# Patient Record
Sex: Female | Born: 1985 | Race: White | Hispanic: No | Smoking: Never smoker
Health system: Southern US, Community
[De-identification: ages and names within clinical notes are randomized; demographics above are authoritative.]

## PROBLEM LIST (undated history)

## (undated) DIAGNOSIS — Z789 Other specified health status: Secondary | ICD-10-CM

## (undated) DIAGNOSIS — O24419 Gestational diabetes mellitus in pregnancy, unspecified control: Secondary | ICD-10-CM

## (undated) HISTORY — DX: Other specified health status: Z78.9

## (undated) HISTORY — DX: Gestational diabetes mellitus in pregnancy, unspecified control: O24.419

## (undated) NOTE — *Deleted (*Deleted)
History     [id number]  Arrival date and time: 09/28/20 1722    Chief Complaint  Patient presents with  . Rupture of Membranes     HPI Heidi [name] is a 75 y.o. at [redacted]w[redacted]d by 13wk [place] with PMHx notable for IUGR, who presents from [facility] for evaluation.   Patient seen for antenatal testing today at [facility] Diagnosed with [facility]FM on 08/14/2020 and infant was at 3% Repeat scan on 09/12/2020, 9% at that time, BPP 10/10 Today patient had BPP and dopplers, report not in but per verbal report from Heidi [name] patient's AFI now decreased 9>4 Sent to MAU for rule out rupture, IV hydration, and repeat BPP once IVF are complete  On history patient reports pain with fetal movements in pelvis that cause cramping and occasional contractions that hurt in her back She attributes this to breech position Yesterday thought she may have urinated unintentionally around 1900 the night prior, was laughing very hard at the time Had to change her pants, no color or odor to fuid No vaginal bleeding Ate a biscuit and had emesis this morning, has not felt nauseous since then, no other nausea or vomiting      OB History    Gravida  4   Para  3   Term  3   Preterm      AB      Living  3     SAB      TAB      Ectopic      Multiple      Live Births              Past Medical History:  Diagnosis Date  . Medical history non-contributory     Past Surgical History:  Procedure Laterality Date  . CESAREAN SECTION      History reviewed. No pertinent family history.  Social History   Socioeconomic History  . Marital status: Significant Other    Spouse name: Not on file  . Number of children: Not on file  . Years of education: Not on file  . Highest education level: Not on file  Occupational History  . Not on file  Tobacco Use  . Smoking status: Never Smoker  . Smokeless tobacco: Never Used  Vaping Use  . Vaping Use: Never used  Substance and Sexual Activity  . Alcohol use: Never  .  Drug use: Never  . Sexual activity: Yes  Other Topics Concern  . Not on file  Social History Narrative  . Not on file   Social Determinants of Health   Financial Resource Strain:   . Difficulty of Paying Living Expenses: Not on file  Food Insecurity:   . Worried About Programme researcher, broadcasting/film/video in the Last Year: Not on file  . Ran Out of Food in the Last Year: Not on file  Transportation Needs:   . Lack of Transportation (Medical): Not on file  . Lack of Transportation (Non-Medical): Not on file  Physical Activity:   . Days of Exercise per Week: Not on file  . Minutes of Exercise per Session: Not on file  Stress:   . Feeling of Stress : Not on file  Social Connections:   . Frequency of Communication with Friends and Family: Not on file  . Frequency of Social Gatherings with Friends and Family: Not on file  . Attends Religious Services: Not on file  . Active Member of Clubs or Organizations: Not on file  . Attends  Club or Organization Meetings: Not on file  . Marital Status: Not on file  Intimate Partner Violence:   . Fear of Current or Ex-Partner: Not on file  . Emotionally Abused: Not on file  . Physically Abused: Not on file  . Sexually Abused: Not on file    No Known Allergies  No current facility-administered medications on file prior to encounter.   Current Outpatient Medications on File Prior to Encounter  Medication Sig Dispense Refill  . Prenatal Vit-Fe Fumarate-FA (PRENATAL VITAMIN PO) Take by mouth.    Marland Kitchen HYDROcodone-acetaminophen (NORCO/VICODIN) 5-325 MG tablet Take 1 tablet by mouth every 6 (six) hours as needed for moderate pain or severe pain. (Patient not taking: Reported on 05/02/2020) 8 tablet 0  . penicillin v potassium (VEETID) 500 MG tablet Take 1 tablet (500 mg total) by mouth 3 (three) times daily. (Patient not taking: Reported on 05/02/2020) 30 tablet 0     ROS Pertinent positives and negative per HPI, all others reviewed and negative  Physical Exam    BP 118/71 (BP Location: Right Arm)   Pulse (!) 101   Temp 97.9 F (36.6 C) (Oral)   Resp 20   Ht 5\' 2"  (1.575 m)   Wt 72.9 kg   LMP 01/29/2020 (Exact Date)   SpO2 100%   BMI 29.39 kg/m   Physical Exam ***  Cervical Exam    Bedside Ultrasound ***  My interpretation: ***  FHT Baseline ***, *** variability, ***accels, ***decels Toco: *** Cat: ***  Labs No results found for this or any previous visit (from the past 24 hour(s)).  Imaging No results found.  MAU Course  Procedures Lab Orders  No laboratory test(s) ordered today   Meds ordered this encounter  Medications  . lactated ringers bolus 1,000 mL   Imaging Orders  No imaging studies ordered today    MDM {Desc; none/mild/moderate/severe:13498}  Assessment and Plan  ***  #FWB FHT Cat *** NST: ***  [name]

---

## 2015-01-22 DIAGNOSIS — O321XX0 Maternal care for breech presentation, not applicable or unspecified: Secondary | ICD-10-CM

## 2019-12-07 ENCOUNTER — Other Ambulatory Visit: Payer: Self-pay

## 2019-12-07 ENCOUNTER — Encounter (HOSPITAL_COMMUNITY): Payer: Self-pay

## 2019-12-07 ENCOUNTER — Ambulatory Visit (HOSPITAL_COMMUNITY)
Admission: EM | Admit: 2019-12-07 | Discharge: 2019-12-07 | Disposition: A | Discharge status: Home / Self Care | Payer: Medicaid - Out of State | Attending: Family Medicine | Admitting: Family Medicine

## 2019-12-07 DIAGNOSIS — K0889 Other specified disorders of teeth and supporting structures: Secondary | ICD-10-CM | POA: Diagnosis not present

## 2019-12-07 MED ORDER — KETOROLAC TROMETHAMINE 60 MG/2ML IM SOLN
INTRAMUSCULAR | Status: AC
  Filled 2019-12-07: qty 2

## 2019-12-07 MED ORDER — PENICILLIN V POTASSIUM 500 MG PO TABS: 500.0000 mg | ORAL_TABLET | Freq: Three times a day (TID) | ORAL | 0 refills | Status: DC

## 2019-12-07 MED ORDER — KETOROLAC TROMETHAMINE 60 MG/2ML IM SOLN
60.0000 mg | Freq: Once | INTRAMUSCULAR | Status: AC
  Administered 2019-12-07: 60 mg via INTRAMUSCULAR

## 2019-12-07 MED ORDER — HYDROCODONE-ACETAMINOPHEN 5-325 MG PO TABS: 1.0000 | ORAL_TABLET | Freq: Four times a day (QID) | ORAL | 0 refills | Status: DC | PRN

## 2019-12-07 NOTE — ED Provider Notes (Signed)
[facility]   [id number] 12/07/19 Arrival Time: 1529  ASSESSMENT & PLAN:  1. Pain, dental     No sign of abscess requiring I&D at this time. Discussed.  Meds ordered this encounter  Medications  . ketorolac (TORADOL) injection 60 mg  . penicillin v potassium (VEETID) 500 MG tablet    Sig: Take 1 tablet (500 mg total) by mouth 3 (three) times daily.    Dispense:  30 tablet    Refill:  0  . HYDROcodone-acetaminophen (NORCO/VICODIN) 5-325 MG tablet    Sig: Take 1 tablet by mouth every 6 (six) hours as needed for moderate pain or severe pain.    Dispense:  8 tablet    Refill:  0    [place] Controlled Substances Registry consulted for this patient. I feel the risk/benefit ratio today is favorable for proceeding with this prescription for a controlled substance. Medication sedation precautions given.  Dental resource written instructions given. She will schedule dental evaluation as soon as possible if not improving over the next 24-48 hours.  Reviewed expectations re: course of current medical issues. Questions answered. Outlined signs and symptoms indicating need for more acute intervention. Patient verbalized understanding. After Visit Summary given.   SUBJECTIVE:  Heidi [name] is a 34 y.o. female who reports gradual onset of right lower dental pain described as aching/throbbing. Present for several days up to one week. Fever: absent. Tolerating PO intake but reports pain with chewing. Normal swallowing. She does not see a dentist regularly. No neck swelling or pain. OTC analgesics without relief.  OBJECTIVE: Vitals:   12/07/19 1544 12/07/19 1546  BP:  122/84  Pulse:  96  Resp:  16  Temp:  98.2 F (36.8 C)  TempSrc:  Oral  SpO2:  99%  Weight: 68 kg     General appearance: alert; no distress HENT: normocephalic; atraumatic; dentition: poor; right lower gums without areas of fluctuance, drainage, or bleeding and with tenderness to palpation; normal jaw movement  without difficulty Neck: supple without LAD; FROM; trachea midline Lungs: normal respirations; unlabored Skin: warm and dry Psychological: alert and cooperative; normal mood and affect  No Known Allergies  History reviewed. No pertinent past medical history.   Social History   Socioeconomic History  . Marital status: Significant Other    Spouse name: Not on file  . Number of children: Not on file  . Years of education: Not on file  . Highest education level: Not on file  Occupational History  . Not on file  Tobacco Use  . Smoking status: Never Smoker  . Smokeless tobacco: Never Used  Substance and Sexual Activity  . Alcohol use: Never  . Drug use: Not on file  . Sexual activity: Yes    Birth control/protection: I.U.D.  Other Topics Concern  . Not on file  Social History Narrative  . Not on file   Social Determinants of Health   Financial Resource Strain:   . Difficulty of Paying Living Expenses: Not on file  Food Insecurity:   . Worried About Programme researcher, broadcasting/film/video in the Last Year: Not on file  . Ran Out of Food in the Last Year: Not on file  Transportation Needs:   . Lack of Transportation (Medical): Not on file  . Lack of Transportation (Non-Medical): Not on file  Physical Activity:   . Days of Exercise per Week: Not on file  . Minutes of Exercise per Session: Not on file  Stress:   . Feeling of Stress :  Not on file  Social Connections:   . Frequency of Communication with Friends and Family: Not on file  . Frequency of Social Gatherings with Friends and Family: Not on file  . Attends Religious Services: Not on file  . Active Member of Clubs or Organizations: Not on file  . Attends Archivist Meetings: Not on file  . Marital Status: Not on file  Intimate Partner Violence:   . Fear of Current or Ex-Partner: Not on file  . Emotionally Abused: Not on file  . Physically Abused: Not on file  . Sexually Abused: Not on file   History reviewed. No  pertinent family history. History reviewed. No pertinent surgical history.   Vanessa Kick, MD 12/08/19 [phone]

## 2019-12-07 NOTE — Discharge Instructions (Signed)

## 2019-12-07 NOTE — ED Triage Notes (Signed)
Pt states she has a broken tooth with a abscess x 1 week or more.

## 2020-04-24 ENCOUNTER — Other Ambulatory Visit: Payer: Self-pay | Admitting: Nurse Practitioner

## 2020-04-24 DIAGNOSIS — Z369 Encounter for antenatal screening, unspecified: Secondary | ICD-10-CM

## 2020-04-24 DIAGNOSIS — Z3A13 13 weeks gestation of pregnancy: Secondary | ICD-10-CM

## 2020-04-27 ENCOUNTER — Encounter: Payer: Self-pay | Admitting: *Deleted

## 2020-05-02 ENCOUNTER — Other Ambulatory Visit: Payer: Self-pay

## 2020-05-02 ENCOUNTER — Ambulatory Visit: Payer: Medicaid Other | Admitting: *Deleted

## 2020-05-02 ENCOUNTER — Ambulatory Visit: Payer: Medicaid Other | Attending: Obstetrics and Gynecology

## 2020-05-02 ENCOUNTER — Ambulatory Visit: Payer: Medicaid Other

## 2020-05-02 VITALS — BP 106/71 | HR 94 | Ht 62.5 in | Wt 157.0 lb

## 2020-05-02 DIAGNOSIS — Z369 Encounter for antenatal screening, unspecified: Secondary | ICD-10-CM | POA: Diagnosis not present

## 2020-05-02 DIAGNOSIS — Z3682 Encounter for antenatal screening for nuchal translucency: Secondary | ICD-10-CM

## 2020-05-02 DIAGNOSIS — O34219 Maternal care for unspecified type scar from previous cesarean delivery: Secondary | ICD-10-CM

## 2020-05-02 DIAGNOSIS — Z3A13 13 weeks gestation of pregnancy: Secondary | ICD-10-CM

## 2020-05-04 ENCOUNTER — Telehealth: Payer: Self-pay | Admitting: Genetic Counselor

## 2020-05-04 LAB — FIRST TRIMESTER SCREEN W/NT
CRL: 72.1 mm
DIA MoM: 1.09
DIA Value: 216.6 pg/mL
Gest Age-Collect: 13.1 weeks
Maternal Age At EDD: 32.5 yr
Nuchal Translucency MoM: 1.01
Nuchal Translucency: 1.6 mm
Number of Fetuses: 1
PAPP-A MoM: 1.15
PAPP-A Value: 1379.1 ng/mL
Test Results:: NEGATIVE
Weight: 157 [lb_av]
hCG MoM: 1.2
hCG Value: 96.9 IU/mL

## 2020-05-04 NOTE — Telephone Encounter (Signed)
I called Ms. 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 We reviewed that the risk for her pregnancy to be affected by Down syndrome decreased from her 1 in 486 age-related risk to less than 1 in 10,000, and the risk for trisomy 18 decreased from her 1 in 951 age-related risk to less than 1 in 10,000 based on the results of this screen. Ms. Bunte was reminded that while this screen significantly reduces the likelihood of the pregnancy being affected by trisomy 97 or trisomy 75, it cannot be considered diagnostic. Diagnostic testing via [facility] or amniocentesis is available should she be interested in pursuing this. Additionally, first trimester screening does not screen for open neural tube defects such as spina bifida. It is recommended that MS-AFP screening be ordered around 16-18 weeks to screen for this. Ms. Crume confirmed that she had no questions about these results.  Gershon Crane, MS, [facility] Genetic Counselor

## 2020-08-21 ENCOUNTER — Other Ambulatory Visit: Payer: Self-pay | Admitting: Obstetrics and Gynecology

## 2020-08-21 DIAGNOSIS — Z364 Encounter for antenatal screening for fetal growth retardation: Secondary | ICD-10-CM

## 2020-09-12 ENCOUNTER — Ambulatory Visit: Payer: Medicaid Other | Attending: Obstetrics and Gynecology

## 2020-09-12 ENCOUNTER — Other Ambulatory Visit: Payer: Self-pay | Admitting: Obstetrics and Gynecology

## 2020-09-12 ENCOUNTER — Other Ambulatory Visit: Payer: Self-pay | Admitting: *Deleted

## 2020-09-12 ENCOUNTER — Ambulatory Visit: Payer: Medicaid Other | Admitting: *Deleted

## 2020-09-12 ENCOUNTER — Encounter: Payer: Self-pay | Admitting: *Deleted

## 2020-09-12 ENCOUNTER — Other Ambulatory Visit: Payer: Self-pay

## 2020-09-12 VITALS — BP 118/70 | HR 91

## 2020-09-12 DIAGNOSIS — Z364 Encounter for antenatal screening for fetal growth retardation: Secondary | ICD-10-CM

## 2020-09-12 DIAGNOSIS — O365930 Maternal care for other known or suspected poor fetal growth, third trimester, not applicable or unspecified: Secondary | ICD-10-CM | POA: Diagnosis present

## 2020-09-12 DIAGNOSIS — O365990 Maternal care for other known or suspected poor fetal growth, unspecified trimester, not applicable or unspecified: Secondary | ICD-10-CM

## 2020-09-12 DIAGNOSIS — O34219 Maternal care for unspecified type scar from previous cesarean delivery: Secondary | ICD-10-CM

## 2020-09-12 DIAGNOSIS — Z3A32 32 weeks gestation of pregnancy: Secondary | ICD-10-CM | POA: Insufficient documentation

## 2020-09-12 NOTE — Procedures (Signed)
Heidi [name] February 19, 1986 [redacted]w[redacted]d  Fetus A Non-Stress Test Interpretation for 09/12/20  Indication: Unsatisfactory BPP, SGA  Fetal Heart Rate A Mode: External Baseline Rate (A): 130 bpm Variability: Moderate Accelerations: 15 x 15 Decelerations: None Multiple birth?: No  Uterine Activity Mode: Palpation, Toco Contraction Frequency (min): None Resting Tone Palpated: Relaxed Resting Time: Adequate  Interpretation (Fetal Testing) Nonstress Test Interpretation: Reactive Comments: Dr. Judeth Cornfield reviewed tracing.

## 2020-09-14 DIAGNOSIS — O365990 Maternal care for other known or suspected poor fetal growth, unspecified trimester, not applicable or unspecified: Secondary | ICD-10-CM | POA: Insufficient documentation

## 2020-09-19 ENCOUNTER — Encounter: Payer: Self-pay | Admitting: *Deleted

## 2020-09-19 ENCOUNTER — Ambulatory Visit: Payer: Medicaid Other | Admitting: *Deleted

## 2020-09-19 ENCOUNTER — Ambulatory Visit: Payer: Medicaid Other | Attending: Obstetrics and Gynecology | Admitting: *Deleted

## 2020-09-19 ENCOUNTER — Other Ambulatory Visit: Payer: Self-pay

## 2020-09-19 DIAGNOSIS — Z3A33 33 weeks gestation of pregnancy: Secondary | ICD-10-CM | POA: Diagnosis not present

## 2020-09-19 DIAGNOSIS — O365930 Maternal care for other known or suspected poor fetal growth, third trimester, not applicable or unspecified: Secondary | ICD-10-CM | POA: Insufficient documentation

## 2020-09-19 DIAGNOSIS — O365990 Maternal care for other known or suspected poor fetal growth, unspecified trimester, not applicable or unspecified: Secondary | ICD-10-CM

## 2020-09-19 NOTE — Procedures (Signed)
Tinzlee Premo 1985-11-08 [redacted]w[redacted]d  Fetus A Non-Stress Test Interpretation for 09/19/20  Indication: IUGR  Fetal Heart Rate A Mode: External Baseline Rate (A): 140 bpm Variability: Moderate Accelerations: 15 x 15 Decelerations: None Multiple birth?: No  Uterine Activity Mode: Palpation, Toco Contraction Frequency (min): None Resting Tone Palpated: Relaxed Resting Time: Adequate  Interpretation (Fetal Testing) Nonstress Test Interpretation: Reactive Comments: Dr. Grace Bushy reviewed tracing.

## 2020-09-28 ENCOUNTER — Ambulatory Visit: Payer: Medicaid Other | Attending: Obstetrics and Gynecology

## 2020-09-28 ENCOUNTER — Inpatient Hospital Stay (HOSPITAL_BASED_OUTPATIENT_CLINIC_OR_DEPARTMENT_OTHER): Payer: Medicaid Other

## 2020-09-28 ENCOUNTER — Encounter (HOSPITAL_COMMUNITY): Payer: Self-pay | Admitting: Family Medicine

## 2020-09-28 ENCOUNTER — Encounter: Payer: Self-pay | Admitting: *Deleted

## 2020-09-28 ENCOUNTER — Ambulatory Visit: Payer: Medicaid Other | Admitting: *Deleted

## 2020-09-28 ENCOUNTER — Inpatient Hospital Stay (HOSPITAL_COMMUNITY)
Admission: AD | Admit: 2020-09-28 | Discharge: 2020-10-03 | DRG: 788 | Disposition: A | Discharge status: Home / Self Care | Payer: Medicaid Other | Attending: Obstetrics and Gynecology | Admitting: Obstetrics and Gynecology

## 2020-09-28 ENCOUNTER — Other Ambulatory Visit: Payer: Self-pay

## 2020-09-28 DIAGNOSIS — O365990 Maternal care for other known or suspected poor fetal growth, unspecified trimester, not applicable or unspecified: Secondary | ICD-10-CM | POA: Insufficient documentation

## 2020-09-28 DIAGNOSIS — O365930 Maternal care for other known or suspected poor fetal growth, third trimester, not applicable or unspecified: Secondary | ICD-10-CM | POA: Diagnosis not present

## 2020-09-28 DIAGNOSIS — Z363 Encounter for antenatal screening for malformations: Secondary | ICD-10-CM | POA: Diagnosis not present

## 2020-09-28 DIAGNOSIS — Z87898 Personal history of other specified conditions: Secondary | ICD-10-CM

## 2020-09-28 DIAGNOSIS — O4100X0 Oligohydramnios, unspecified trimester, not applicable or unspecified: Secondary | ICD-10-CM | POA: Diagnosis present

## 2020-09-28 DIAGNOSIS — Z98891 History of uterine scar from previous surgery: Secondary | ICD-10-CM | POA: Diagnosis present

## 2020-09-28 DIAGNOSIS — O4103X0 Oligohydramnios, third trimester, not applicable or unspecified: Secondary | ICD-10-CM

## 2020-09-28 DIAGNOSIS — O365931 Maternal care for other known or suspected poor fetal growth, third trimester, fetus 1: Secondary | ICD-10-CM | POA: Insufficient documentation

## 2020-09-28 DIAGNOSIS — O34219 Maternal care for unspecified type scar from previous cesarean delivery: Secondary | ICD-10-CM | POA: Diagnosis not present

## 2020-09-28 DIAGNOSIS — O321XX0 Maternal care for breech presentation, not applicable or unspecified: Secondary | ICD-10-CM

## 2020-09-28 DIAGNOSIS — Z23 Encounter for immunization: Secondary | ICD-10-CM

## 2020-09-28 DIAGNOSIS — Z3A34 34 weeks gestation of pregnancy: Secondary | ICD-10-CM

## 2020-09-28 DIAGNOSIS — O34211 Maternal care for low transverse scar from previous cesarean delivery: Secondary | ICD-10-CM | POA: Diagnosis present

## 2020-09-28 DIAGNOSIS — Z20822 Contact with and (suspected) exposure to covid-19: Secondary | ICD-10-CM | POA: Diagnosis present

## 2020-09-28 LAB — WET PREP, GENITAL
Clue Cells Wet Prep HPF POC: NONE SEEN
Sperm: NONE SEEN
Trich, Wet Prep: NONE SEEN
Yeast Wet Prep HPF POC: NONE SEEN

## 2020-09-28 LAB — TYPE AND SCREEN
ABO/RH(D): A POS
Antibody Screen: NEGATIVE

## 2020-09-28 LAB — CBC
HCT: 30.5 % — ABNORMAL LOW (ref 36.0–46.0)
Hemoglobin: 10.4 g/dL — ABNORMAL LOW (ref 12.0–15.0)
MCH: 29.8 pg (ref 26.0–34.0)
MCHC: 34.1 g/dL (ref 30.0–36.0)
MCV: 87.4 fL (ref 80.0–100.0)
Platelets: 200 10*3/uL (ref 150–400)
RBC: 3.49 MIL/uL — ABNORMAL LOW (ref 3.87–5.11)
RDW: 12.7 % (ref 11.5–15.5)
WBC: 10.2 10*3/uL (ref 4.0–10.5)
nRBC: 0 % (ref 0.0–0.2)

## 2020-09-28 LAB — RESP PANEL BY RT-PCR (FLU A&B, COVID) ARPGX2
Influenza A by PCR: NEGATIVE
Influenza B by PCR: NEGATIVE
SARS Coronavirus 2 by RT PCR: NEGATIVE

## 2020-09-28 LAB — AMNISURE RUPTURE OF MEMBRANE (ROM) NOT AT ARMC: Amnisure ROM: NEGATIVE

## 2020-09-28 MED ORDER — PRENATAL MULTIVITAMIN CH
1.0000 | ORAL_TABLET | Freq: Every day | ORAL | Status: DC
  Administered 2020-09-29: 1 via ORAL
  Filled 2020-09-28: qty 1

## 2020-09-28 MED ORDER — DOCUSATE SODIUM 100 MG PO CAPS
100.0000 mg | ORAL_CAPSULE | Freq: Every day | ORAL | Status: DC
  Administered 2020-09-29: 100 mg via ORAL
  Filled 2020-09-28: qty 1

## 2020-09-28 MED ORDER — BETAMETHASONE SOD PHOS & ACET 6 (3-3) MG/ML IJ SUSP
12.0000 mg | INTRAMUSCULAR | Status: AC
  Administered 2020-09-28 – 2020-09-29 (×2): 12 mg via INTRAMUSCULAR
  Filled 2020-09-28 (×2): qty 5

## 2020-09-28 MED ORDER — ZOLPIDEM TARTRATE 5 MG PO TABS: 5.0000 mg | ORAL_TABLET | Freq: Every evening | ORAL | Status: DC | PRN

## 2020-09-28 MED ORDER — CALCIUM CARBONATE ANTACID 500 MG PO CHEW: 2.0000 | CHEWABLE_TABLET | ORAL | Status: DC | PRN

## 2020-09-28 MED ORDER — ACETAMINOPHEN 325 MG PO TABS
650.0000 mg | ORAL_TABLET | ORAL | Status: DC | PRN
  Administered 2020-09-29: 650 mg via ORAL
  Filled 2020-09-28 (×2): qty 2

## 2020-09-28 MED ORDER — LACTATED RINGERS IV SOLN: INTRAVENOUS | Status: DC

## 2020-09-28 MED ORDER — LACTATED RINGERS IV BOLUS
1000.0000 mL | Freq: Once | INTRAVENOUS | Status: AC
  Administered 2020-09-28: 1000 mL via INTRAVENOUS

## 2020-09-28 NOTE — MAU Note (Signed)
Sent to MAU from MFM for evaluation of ROM.  Pt reports had U/S today and low fluid noted and needs further evaluation.  Denies VB.  Endorses +FM.

## 2020-09-28 NOTE — H&P (Signed)
FACULTY PRACTICE ANTEPARTUM ADMISSION HISTORY AND PHYSICAL NOTE   History of Present Illness: Heidi [name] is a 34 y.o. [id number] at [redacted]w[redacted]d admitted for oligohydramnios.   Patient reports the fetal movement as active. Patient reports uterine contraction  activity as none, has some cramping. Patient reports  vaginal bleeding as none. Patient describes fluid per vagina as None. Fetal presentation is breech.  Patient seen for antenatal testing today at [facility] Diagnosed with MFM on 08/14/2020 and infant was at 3% Repeat scan on 09/12/2020, 9% at that time, BPP 10/10 Today patient had BPP and dopplers, report not in but per verbal report from Dr. Parke Poisson patient's AFI now decreased 9>4 Sent to MAU for rule out rupture, IV hydration, and repeat BPP once IVF are complete  On history patient reports pain with fetal movements in pelvis that cause cramping and occasional contractions that hurt in her back She attributes this to breech position Yesterday thought she may have urinated unintentionally around 1900 the night prior, was laughing very hard at the time Had to change her pants, no color or odor to fuid No vaginal bleeding Ate a biscuit and had emesis this morning, has not felt nauseous since then, no other nausea or vomiting  Patient Active Problem List   Diagnosis Date Noted  . Oligohydramnios 09/28/2020  . Pregnancy affected by fetal growth restriction 09/14/2020    Past Medical History:  Diagnosis Date  . Medical history non-contributory     Past Surgical History:  Procedure Laterality Date  . CESAREAN SECTION      OB History  Gravida Para Term Preterm AB Living  4 3 3     3   SAB TAB Ectopic Multiple Live Births               # Outcome Date GA Lbr Len/2nd Weight Sex Delivery Anes PTL Lv  4 Current           3 Term 01/22/15     CS-Unspec     2 Term 07/10/12     Vag-Spont     1 Term 01/05/07     Vag-Spont       Social History   Socioeconomic History  . Marital  status: Significant Other    Spouse name: Not on file  . Number of children: Not on file  . Years of education: Not on file  . Highest education level: Not on file  Occupational History  . Not on file  Tobacco Use  . Smoking status: Never Smoker  . Smokeless tobacco: Never Used  Vaping Use  . Vaping Use: Never used  Substance and Sexual Activity  . Alcohol use: Never  . Drug use: Never  . Sexual activity: Yes  Other Topics Concern  . Not on file  Social History Narrative  . Not on file   Social Determinants of Health   Financial Resource Strain:   . Difficulty of Paying Living Expenses: Not on file  Food Insecurity:   . Worried About Programme researcher, broadcasting/film/video in the Last Year: Not on file  . Ran Out of Food in the Last Year: Not on file  Transportation Needs:   . Lack of Transportation (Medical): Not on file  . Lack of Transportation (Non-Medical): Not on file  Physical Activity:   . Days of Exercise per Week: Not on file  . Minutes of Exercise per Session: Not on file  Stress:   . Feeling of Stress : Not on file  Social Connections:   .  Frequency of Communication with Friends and Family: Not on file  . Frequency of Social Gatherings with Friends and Family: Not on file  . Attends Religious Services: Not on file  . Active Member of Clubs or Organizations: Not on file  . Attends Banker Meetings: Not on file  . Marital Status: Not on file    History reviewed. No pertinent family history.  No Known Allergies  Medications Prior to Admission  Medication Sig Dispense Refill Last Dose  . Prenatal Vit-Fe Fumarate-FA (PRENATAL VITAMIN PO) Take by mouth.   09/28/2020 at Unknown time  . HYDROcodone-acetaminophen (NORCO/VICODIN) 5-325 MG tablet Take 1 tablet by mouth every 6 (six) hours as needed for moderate pain or severe pain. (Patient not taking: Reported on 05/02/2020) 8 tablet 0   . penicillin v potassium (VEETID) 500 MG tablet Take 1 tablet (500 mg total) by mouth  3 (three) times daily. (Patient not taking: Reported on 05/02/2020) 30 tablet 0     Pertinent positives and negative per HPI, all others reviewed and negative  Vitals:  BP 118/71 (BP Location: Right Arm)   Pulse (!) 101   Temp 97.9 F (36.6 C) (Oral)   Resp 20   Ht  (1.575 m)   Wt 72.9 kg   LMP 01/29/2020 (Exact Date)   SpO2 100%   BMI 29.39 kg/m  Physical Examination: Gen: well appearing, no acute distress Eyes: no scleral icterus Pulm: Breathing comfortable on room air with no distress Neuro: speech intact, normal gait Psych: normal mood and affect Skin: no rashes or lesions seen on limited exam Abd: nontender  Cervix: Evaluated by sterile speculum exam., Dilation: closed, no pooling. Membranes:sterile speculum exam: neg pool, amnisure neg Fetal Monitoring:Baseline: 140 bpm, Variability: Good {> 6 bpm), Accelerations: Reactive and Decelerations: Absent Tocometer: Rare contractions  Labs:  Results for orders placed or performed during the hospital encounter of 09/28/20 (from the past 24 hour(s))  Wet prep, genital   Collection Time: 09/28/20  8:14 PM  Result Value Ref Range   Yeast Wet Prep HPF POC NONE SEEN NONE SEEN   Trich, Wet Prep NONE SEEN NONE SEEN   Clue Cells Wet Prep HPF POC NONE SEEN NONE SEEN   WBC, Wet Prep HPF POC MANY (A) NONE SEEN   Sperm NONE SEEN   Amnisure rupture of membrane (rom)not at [facility]   Collection Time: 09/28/20  8:14 PM  Result Value Ref Range   Amnisure ROM NEGATIVE   Type and screen [facility]   Collection Time: 09/28/20  8:51 PM  Result Value Ref Range   ABO/RH(D) PENDING    Antibody Screen PENDING    Sample Expiration      10/01/2020,2359 Performed at [facility], [address], [place], [place] [address]     Imaging Studies: [place] MFM FETAL BPP W/NONSTRESS  Result Date: 09/12/2020 ----------------------------------------------------------------------  OBSTETRICS REPORT                       (Signed  Final 09/12/2020 04:35 pm) ---------------------------------------------------------------------- Patient Info  ID #:       [id number]                          D.O.B.:  [birth year] (34 yrs)  Name:       IDELLE Harpole                  Visit Date: 09/12/2020  01:02 pm ---------------------------------------------------------------------- Performed By  Attending:        Noralee Space MD        Ref. Address:     [address]                                                             [place], [place]                                                             [address]  Performed By:     Tommie Raymond BS,       [facility]                    RD[facility], RVT                                [facility]  Referred By:      Mordecai Maes                    [facility]-                    Faculty Physician ---------------------------------------------------------------------- Orders  #  Description                           Code        Ordered By  1  [place] MFM OB DETAIL +14 WK               76811.01    PEGGY CONSTANT  2  [place] MFM UA CORD DOPPLER                76820.02    PEGGY CONSTANT  3  [place] MFM FETAL BPP  76818.5     PEGGY CONSTANT     W/NONSTRESS -----------------------------------------21308.6-----------------------------  #  Order #                     Accession #                Episode #  1  [id number]                   [id number]                 [id number]  2  [id number]                   [id number]                 [id number]  3  [id number]                   [phone]                 [id number] ---------------------------------------------------------------------- Indications  [redacted] weeks gestation of pregnancy                Z3A.32  Previous cesarean delivery, antepartum         O34.219  Small for  gestational age fetus affecting      O36.5990  management of mother  Encounter for antenatal screening for          Z36.3  malformations ---------------------------------------------------------------------- Fetal Evaluation  Num Of Fetuses:         1  Fetal Heart Rate(bpm):  136  Cardiac Activity:       Observed  Presentation:           Breech  Placenta:               Posterior  P. Cord Insertion:      Visualized, central  Amniotic Fluid  AFI FV:      Within normal limits  AFI Sum(cm)     %Tile       Largest Pocket(cm)  9.5             13          3.7  RUQ(cm)       RLQ(cm)       LUQ(cm)        LLQ(cm)  3.7           3             0              2.8 ---------------------------------------------------------------------- Biophysical Evaluation  Amniotic F.V:   Pocket => 2 cm             F. Tone:        Observed  F. Movement:    Observed                   N.S.T:          Reactive  F. Breathing:   Observed                   Score:          10/10 ---------------------------------------------------------------------- Biometry  BPD:      74.1  mm     G. Age:  29w 5d        < 1  %    CI:        72.32   %    70 -  86                                                          FL/HC:      21.9   %    19.1 - 21.3  HC:      277.2  mm     G. Age:  30w 2d        < 1  %    HC/AC:      1.03        0.96 - 1.17  AC:      270.2  mm     G. Age:  31w 1d         15  %    FL/BPD:     81.8   %    71 - 87  FL:       60.6  mm     G. Age:  31w 4d         16  %    FL/AC:      22.4   %    20 - 24  HUM:      52.3  mm     G. Age:  30w 3d         15  %  CER:      42.6  mm     G. Age:  33w 5d         60  %  LV:        6.2  mm  CM:        8.5  mm  Est. FW:    1687  gm    3 lb 12 oz       9  % ---------------------------------------------------------------------- OB History  Gravidity:    1 ---------------------------------------------------------------------- Gestational Age  LMP:           32w 3d        Date:  01/29/20                 EDD:   11/04/20   U/S Today:     30w 5d                                        EDD:   11/16/20  Best:          32w 3d     Det. By:  LMP  (01/29/20)          EDD:   11/04/20 ---------------------------------------------------------------------- Anatomy  Cranium:               Appears normal         Aortic Arch:            Not well visualized  Cavum:                 Appears normal         Ductal Arch:            Not well visualized  Ventricles:            Appears normal         Diaphragm:  Appears normal  Choroid Plexus:        Not well visualized    Stomach:                Appears normal, left                                                                        sided  Cerebellum:            Appears normal         Abdomen:                Appears normal  Posterior Fossa:       Appears normal         Abdominal Wall:         Not well visualized  Nuchal Fold:           Not well visualized    Cord Vessels:           Appears normal (3                                                                        vessel cord)  Face:                  Orbits nl; profile not Kidneys:                Appear normal                         well visualized  Lips:                  Not well visualized    Bladder:                Appears normal  Thoracic:              Appears normal         Spine:                  Not well visualized  Heart:                 Pericardial            Upper Extremities:      Visualized Limited                         effusion  RVOT:                  Appears normal         Lower Extremities:      Visualized Limited  LVOT:                  Appears normal  Other:  Fetus appears to be female. Technically difficult due to advanced GA          and fetal position. ---------------------------------------------------------------------- Doppler - Fetal Vessels  Umbilical Artery   S/D     %tile      RI    %tile      PI    %tile     PSV    ADFV    RDFV                                                     (cm/s)   2.31       28     1.43   > 97.5    2.08   > 97.5    22.49      No      No ---------------------------------------------------------------------- Cervix Uterus Adnexa  Cervix  Not visualized (advanced GA >24wks)  Uterus  No abnormality visualized.  Right Ovary  Within normal limits. No adnexal mass visualized.  Left Ovary  Within normal limits. No adnexal mass visualized.  Cul De Sac  No free fluid seen.  Adnexa  No abnormality visualized. ---------------------------------------------------------------------- Impression  G4 P3. Patient is here for a second opinion ultrasound.  On  ultrasound performed on 08/14/2020, the estimated fetal  weight was at the 3rd percentile.  On first trimester screening, the risks of fetal aneuploidies  were not increased.  Patient reports no chronic medical  conditions.  Blood pressure today at our office is 118/70  mmHg.  Obstetric history is significant for 2 vaginal deliveries followed  by a cesarean section.  Her children weighed between 5+  and 6+ pounds at birth.  All are in good health.  On today's ultrasound, amniotic fluid is normal and good fetal  activity seen.  The estimated fetal weight is at the 9th  percentile.  Head circumference measurement is at between -  2 and -1 SD (no evidence of microcephaly).  Mild  physiological pericardial effusion is seen.  Fetal anatomical  survey appears normal but limited by advanced gestational  age.Antenatal testing is reassuring. Umbilical artery Doppler  PI is increased (above the 95th percentile). S/D ratio is  normal. NST is reactive.  BPP 10/10.  I explained the finding of fetal growth restriction based on the  estimated fetal weight.  I also informed her that about 20% of  constitutionally small fetus have similar findings.  However,  we recommend weekly antenatal testing till delivery. ---------------------------------------------------------------------- Recommendations  -NST next week.  -BPP and UA Doppler in 2 weeks and then weekly till delivery.  ----------------------------------------------------------------------                  Noralee Space, MD Electronically Signed Final Report   09/12/2020 04:35 pm ----------------------------------------------------------------------  [place] MFM FETAL BPP WO NON STRESS  Result Date: 09/28/2020 ----------------------------------------------------------------------  OBSTETRICS REPORT                       (Signed Final 09/28/2020 07:39 pm) ---------------------------------------------------------------------- Patient Info  ID #:       [id number]                          D.O.B.:  1986-06-01 (34 yrs)  Name:       RHIANNE Hedges                  Visit Date: 09/28/2020 03:17 pm ---------------------------------------------------------------------- Performed By  Attending:  Ma Rings MD         Ref. Address:      [address]                                                              [place], [place]                                                              [address]  Performed By:     Birdena Crandall        Location[facility]                    RDMS,RVT                                  [facility]  Referred By:      [facility]-                    Faculty Physician ---------------------------------------------------------------------- Orders  #  Description                           Code        Ordered By  1  [place] MFM FETAL BPP WO NON               76819.01    RAVI SHANKAR     STRESS  2  [place] MFM UA CORD DOPPLER                60454.09    RAVI SHANKAR ----------------------------------------------------------------------  #  Order #  Accession #                Episode #  1  [id number]                   [id number]                 [id number]   2  [id number]                   [id number]                 [id number] ---------------------------------------------------------------------- Indications  Maternal care for known or suspected poor       O36.5931  fetal growth, third trimester, fetus 1 IUGR  Previous cesarean delivery, antepartum          O34.219  Small for gestational age fetus affecting       O63.5990  management of mother  Encounter for antenatal screening for           Z36.3  malformations  [redacted] weeks gestation of pregnancy                 Z3A.34 ---------------------------------------------------------------------- Fetal Evaluation  Num Of Fetuses:          1  Fetal Heart Rate(bpm):   129  Cardiac Activity:        Observed  Presentation:            Breech  Placenta:                Posterior  P. Cord Insertion:       Previously Visualized  Amniotic Fluid  AFI FV:      Subjectively low-normal  AFI Sum(cm)     %Tile       Largest Pocket(cm)  4.1             < 3         1.5  RUQ(cm)       RLQ(cm)       LUQ(cm)        LLQ(cm)  1.5           0.9           0.8            0.9 ---------------------------------------------------------------------- Biophysical Evaluation  Amniotic F.V:   Pocket => 2 cm             F. Tone:         Observed  F. Movement:    Observed                   Score:           8/8  F. Breathing:   Observed ---------------------------------------------------------------------- OB History  Gravidity:    1 ---------------------------------------------------------------------- Gestational Age  LMP:           34w 5d        Date:  01/29/20                 EDD:   11/04/20  Best:          34w 5d     Det. By:  LMP  (01/29/20)          EDD:   11/04/20 ---------------------------------------------------------------------- Anatomy  Cranium:               Appears normal         Stomach:  Appears normal, left                                                                        sided  Choroid Plexus:        Appears normal         Cord  Vessels:           Appears normal (3                                                                        vessel cord)  Heart:                 Pericardial            Kidneys:                Appear normal                         effusion  RVOT:                  l                      Bladder:                Appears normal  Diaphragm:             Appears normal ---------------------------------------------------------------------- Doppler - Fetal Vessels  Umbilical Artery    S/D    %tile      RI    %tile                             ADFV    RDFV    2.9       75    0.65       77                                No      No ---------------------------------------------------------------------- Comments  This patient was seen due to an IUGR fetus.  She denies any  problems since her last exam.  She reports feeling vigorous  fetal movements throughout the day.  A biophysical profile performed today was 6 out of 8. She  received a -2 as a greater than 2 cm pocket of amniotic fluid  was not noted on today's exam.  The total amniotic fluid level appeared low, with a total AFI of  <5 cm. The patient denies any leakage of fluid.  A small amount of pericardial effusion (less than 1 cm) was  noted today.  This may be a physiologic finding or related to  the fetal position.  We will continue to assess this finding  during her future exams.  Doppler studies of the umbilical arteries performed due to  fetal growth restriction showed a normal S/D  ratio of 2.9.  There were no signs of absent or reversed end-diastolic flow  noted today.  Due to the low amniotic fluid levels noted today, the patient  was sent to the MAU for a rupture membrane check and IV  hydration.  Should she rule out for rupture of membranes and her  amniotic fluid level remains low, she should be given a  course of antenatal corticosteroids and be observed  overnight for IV hydration. ----------------------------------------------------------------------                    Ma Rings, MD Electronically Signed Final Report   09/28/2020 07:39 pm ----------------------------------------------------------------------  [place] MFM OB DETAIL +14 WK  Result Date: 09/12/2020 ----------------------------------------------------------------------  OBSTETRICS REPORT                       (Signed Final 09/12/2020 04:35 pm) ---------------------------------------------------------------------- Patient Info  ID #:       [id number]                          D.O.B.:  05-19-1986 (34 yrs)  Name:       Archie Patten Wieand                  Visit Date: 09/12/2020 01:02 pm ---------------------------------------------------------------------- Performed By  Attending:        Noralee Space MD        Ref. Address:     [address]                                                             [place], [place]                                                             [address]  Performed By:     Tommie Raymond BS,       Location[facility]         [facility]                    RDMS, RVT                                [facility]  Referred By:      [facility]-                    Faculty Physician ---------------------------------------------------------------------- Orders  #  Description                           Code        Ordered By  1  [place] MFM OB DETAIL +14 WK               [id number]    PEGGY CONSTANT  2  [place] MFM UA CORD DOPPLER                76820.02    PEGGY CONSTANT  3  [place] MFM FETAL BPP                      76818.5     PEGGY CONSTANT     W/NONSTRESS ----------------------------------------------------------------------  #  Order #                     Accession #                Episode #  1  [id number]                   [id number]                 [id number]  2  [id number]                    [id number]                 [id number]  3  [id number]                   [id number]                 [id number] ---------------------------------------------------------------------- Indications  [redacted] weeks gestation of pregnancy                Z3A.32  Previous cesarean delivery, antepartum         O34.219  Small for gestational age fetus affecting      O36.5990  management of mother  Encounter for antenatal screening for          Z36.3  malformations ---------------------------------------------------------------------- Fetal Evaluation  Num Of Fetuses:         1  Fetal Heart Rate(bpm):  136  Cardiac Activity:       Observed  Presentation:           Breech  Placenta:               Posterior  P. Cord Insertion:      Visualized, central  Amniotic Fluid  AFI FV:      Within normal limits  AFI Sum(cm)     %Tile       Largest Pocket(cm)  9.5             13          3.7  RUQ(cm)       RLQ(cm)       LUQ(cm)        LLQ(cm)  3.7           3  0              2.8 ---------------------------------------------------------------------- Biophysical Evaluation  Amniotic F.V:   Pocket => 2 cm             F. Tone:        Observed  F. Movement:    Observed                   N.S.T:          Reactive  F. Breathing:   Observed                   Score:          10/10 ---------------------------------------------------------------------- Biometry  BPD:      74.1  mm     G. Age:  29w 5d        < 1  %    CI:        72.32   %    70 - 86                                                          FL/HC:      21.9   %    19.1 - 21.3  HC:      277.2  mm     G. Age:  30w 2d        < 1  %    HC/AC:      1.03        0.96 - 1.17  AC:      270.2  mm     G. Age:  31w 1d         15  %    FL/BPD:     81.8   %    71 - 87  FL:       60.6  mm     G. Age:  31w 4d         16  %    FL/AC:      22.4   %    20 - 24  HUM:      52.3  mm     G. Age:  30w 3d         15  %  CER:      42.6  mm     G. Age:  33w 5d         60  %  LV:        6.2  mm  CM:         8.5  mm  Est. FW:    1687  gm    3 lb 12 oz       9  % ---------------------------------------------------------------------- OB History  Gravidity:    1 ---------------------------------------------------------------------- Gestational Age  LMP:           32w 3d        Date:  01/29/20                 EDD:   11/04/20  U/S Today:     30w 5d  EDD:   11/16/20  Best:          Armida Sans 3d     Det. By:  LMP  (01/29/20)          EDD:   11/04/20 ---------------------------------------------------------------------- Anatomy  Cranium:               Appears normal         Aortic Arch:            Not well visualized  Cavum:                 Appears normal         Ductal Arch:            Not well visualized  Ventricles:            Appears normal         Diaphragm:              Appears normal  Choroid Plexus:        Not well visualized    Stomach:                Appears normal, left                                                                        sided  Cerebellum:            Appears normal         Abdomen:                Appears normal  Posterior Fossa:       Appears normal         Abdominal Wall:         Not well visualized  Nuchal Fold:           Not well visualized    Cord Vessels:           Appears normal (3                                                                        vessel cord)  Face:                  Orbits nl; profile not Kidneys:                Appear normal                         well visualized  Lips:                  Not well visualized    Bladder:                Appears normal  Thoracic:              Appears normal         Spine:  Not well visualized  Heart:                 Pericardial            Upper Extremities:      Visualized Limited                         effusion  RVOT:                  Appears normal         Lower Extremities:      Visualized Limited  LVOT:                  Appears normal  Other:  Fetus appears to be female. Technically  difficult due to advanced GA          and fetal position. ---------------------------------------------------------------------- Doppler - Fetal Vessels  Umbilical Artery   S/D     %tile      RI    %tile      PI    %tile     PSV    ADFV    RDFV                                                     (cm/s)   2.31       28    1.43   > 97.5    2.08   > 97.5    22.49      No      No ---------------------------------------------------------------------- Cervix Uterus Adnexa  Cervix  Not visualized (advanced GA >24wks)  Uterus  No abnormality visualized.  Right Ovary  Within normal limits. No adnexal mass visualized.  Left Ovary  Within normal limits. No adnexal mass visualized.  Cul De Sac  No free fluid seen.  Adnexa  No abnormality visualized. ---------------------------------------------------------------------- Impression  G4 P3. Patient is here for a second opinion ultrasound.  On  ultrasound performed on 08/14/2020, the estimated fetal  weight was at the 3rd percentile.  On first trimester screening, the risks of fetal aneuploidies  were not increased.  Patient reports no chronic medical  conditions.  Blood pressure today at our office is 118/70  mmHg.  Obstetric history is significant for 2 vaginal deliveries followed  by a cesarean section.  Her children weighed between 5+  and 6+ pounds at birth.  All are in good health.  On today's ultrasound, amniotic fluid is normal and good fetal  activity seen.  The estimated fetal weight is at the 9th  percentile.  Head circumference measurement is at between -  2 and -1 SD (no evidence of microcephaly).  Mild  physiological pericardial effusion is seen.  Fetal anatomical  survey appears normal but limited by advanced gestational  age.Antenatal testing is reassuring. Umbilical artery Doppler  PI is increased (above the 95th percentile). S/D ratio is  normal. NST is reactive.  BPP 10/10.  I explained the finding of fetal growth restriction based on the  estimated fetal weight.   I also informed her that about 20% of  constitutionally small fetus have similar findings.  However,  we recommend weekly antenatal testing till delivery. ---------------------------------------------------------------------- Recommendations  -NST next week.  -BPP and UA Doppler in 2 weeks and then weekly till delivery. ----------------------------------------------------------------------  Noralee Space, MD Electronically Signed Final Report   09/12/2020 04:35 pm ----------------------------------------------------------------------  [place] MFM UA CORD DOPPLER  Result Date: 09/28/2020 ----------------------------------------------------------------------  OBSTETRICS REPORT                       (Signed Final 09/28/2020 07:39 pm) ---------------------------------------------------------------------- Patient Info  ID #:       [id number]                          D.O.B.:  Mar 13, 1986 (34 yrs)  Name:       AHLEY Dyk                  Visit Date: 09/28/2020 03:17 pm ---------------------------------------------------------------------- Performed By  Attending:        Ma Rings MD         Ref. Address:      [address]                                                              [place], [place]                                                              [address]  Performed By:     Birdena Crandall        [facility]                    RD[facility],R[facility]                                  [facility]  Referred By:      [facility]-  Faculty Physician ---------------------------------------------------------------------- Orders  #  Description                           Code        Ordered By  1  [place] MFM FETAL BPP WO NON                76819.01    RAVI SHANKAR     STRESS  2  [place] MFM UA CORD DOPPLER                76820.02    RAVI SHANKAR ----------------------------------------------------------------------  #  Order #                     Accession #                Episode #  1  [id number]                   [id number]                 [id number]  2  [id number]                   [id number]                 [id number] ---------------------------------------------------------------------- Indications  Maternal care for known or suspected poor       O36.5931  fetal growth, third trimester, fetus 1 IUGR  Previous cesarean delivery, antepartum          O34.219  Small for gestational age fetus affecting       O46.5990  management of mother  Encounter for antenatal screening for           Z36.3  malformations  [redacted] weeks gestation of pregnancy                 Z3A.34 ---------------------------------------------------------------------- Fetal Evaluation  Num Of Fetuses:          1  Fetal Heart Rate(bpm):   129  Cardiac Activity:        Observed  Presentation:            Breech  Placenta:                Posterior  P. Cord Insertion:       Previously Visualized  Amniotic Fluid  AFI FV:      Subjectively low-normal  AFI Sum(cm)     %Tile       Largest Pocket(cm)  4.1             < 3         1.5  RUQ(cm)       RLQ(cm)       LUQ(cm)        LLQ(cm)  1.5           0.9           0.8            0.9 ---------------------------------------------------------------------- Biophysical Evaluation  Amniotic F.V:   Pocket => 2 cm             F. Tone:         Observed  F. Movement:    Observed                   Score:           8/8  F.  Breathing:   Observed ---------------------------------------------------------------------- OB History  Gravidity:    1 ---------------------------------------------------------------------- Gestational Age  LMP:           34w 5d        Date:  01/29/20                 EDD:   11/04/20  Best:          34w 5d     Det. By:  LMP  (01/29/20)           EDD:   11/04/20 ---------------------------------------------------------------------- Anatomy  Cranium:               Appears normal         Stomach:                Appears normal, left                                                                        sided  Choroid Plexus:        Appears normal         Cord Vessels:           Appears normal (3                                                                        vessel cord)  Heart:                 Pericardial            Kidneys:                Appear normal                         effusion  RVOT:                  l                      Bladder:                Appears normal  Diaphragm:             Appears normal ---------------------------------------------------------------------- Doppler - Fetal Vessels  Umbilical Artery    S/D    %tile      RI    %tile                             ADFV    RDFV    2.9       75    0.65       77                                No      No ---------------------------------------------------------------------- Comments  This patient was seen due to  an IUGR fetus.  She denies any  problems since her last exam.  She reports feeling vigorous  fetal movements throughout the day.  A biophysical profile performed today was 6 out of 8. She  received a -2 as a greater than 2 cm pocket of amniotic fluid  was not noted on today's exam.  The total amniotic fluid level appeared low, with a total AFI of  <5 cm. The patient denies any leakage of fluid.  A small amount of pericardial effusion (less than 1 cm) was  noted today.  This may be a physiologic finding or related to  the fetal position.  We will continue to assess this finding  during her future exams.  Doppler studies of the umbilical arteries performed due to  fetal growth restriction showed a normal S/D ratio of 2.9.  There were no signs of absent or reversed end-diastolic flow  noted today.  Due to the low amniotic fluid levels noted today, the patient  was sent to the MAU for a  rupture membrane check and IV  hydration.  Should she rule out for rupture of membranes and her  amniotic fluid level remains low, she should be given a  course of antenatal corticosteroids and be observed  overnight for IV hydration. ----------------------------------------------------------------------                   Ma Rings, MD Electronically Signed Final Report   09/28/2020 07:39 pm ----------------------------------------------------------------------  [place] MFM UA CORD DOPPLER  Result Date: 09/12/2020 ----------------------------------------------------------------------  OBSTETRICS REPORT                       (Signed Final 09/12/2020 04:35 pm) ---------------------------------------------------------------------- Patient Info  ID #:       [id number]                          D.O.B.:  1985/12/15 (34 yrs)  Name:       Archie Patten Theroux                  Visit Date: 09/12/2020 01:02 pm ---------------------------------------------------------------------- Performed By  Attending:        Noralee Space MD        Ref. Address:     [address]                                                             [place], [place]                                                             [address]  Performed By:     Tommie Raymond BS,       Location:  [facility]                    RDMS, RVT                                [facility]  Referred By:      [facility]-                    Faculty Physician ---------------------------------------------------------------------- Orders  #  Description                           Code        Ordered By  1  [place] MFM OB DETAIL +14 WK               [id number]    PEGGY CONSTANT  2  [place] MFM UA CORD DOPPLER                76820.02    PEGGY  CONSTANT  3  [place] MFM FETAL BPP                      67124.5     PEGGY CONSTANT     W/NONSTRESS ----------------------------------------------------------------------  #  Order #                     Accession #                Episode #  1  [id number]                   [id number]                 [id number]  2  [id number]                   [id number]                 [id number]  3  [id number]                   [id number]                 [id number] ---------------------------------------------------------------------- Indications  [redacted] weeks gestation of pregnancy                Z3A.32  Previous cesarean delivery, antepartum         O34.219  Small for gestational age fetus affecting      O36.5990  management of mother  Encounter for antenatal screening for  Z36.3  malformations ---------------------------------------------------------------------- Fetal Evaluation  Num Of Fetuses:         1  Fetal Heart Rate(bpm):  136  Cardiac Activity:       Observed  Presentation:           Breech  Placenta:               Posterior  P. Cord Insertion:      Visualized, central  Amniotic Fluid  AFI FV:      Within normal limits  AFI Sum(cm)     %Tile       Largest Pocket(cm)  9.5             13          3.7  RUQ(cm)       RLQ(cm)       LUQ(cm)        LLQ(cm)  3.7           3             0              2.8 ---------------------------------------------------------------------- Biophysical Evaluation  Amniotic F.V:   Pocket => 2 cm             F. Tone:        Observed  F. Movement:    Observed                   N.S.T:          Reactive  F. Breathing:   Observed                   Score:          10/10 ---------------------------------------------------------------------- Biometry  BPD:      74.1  mm     G. Age:  29w 5d        < 1  %    CI:        72.32   %    70 - 86                                                          FL/HC:      21.9   %    19.1 - 21.3  HC:      277.2  mm     G. Age:  30w 2d        < 1  %    HC/AC:      1.03         0.96 - 1.17  AC:      270.2  mm     G. Age:  31w 1d         15  %    FL/BPD:     81.8   %    71 - 87  FL:       60.6  mm     G. Age:  31w 4d         16  %    FL/AC:      22.4   %    20 - 24  HUM:      52.3  mm     G. Age:  61w 3d  15  %  CER:      42.6  mm     G. Age:  33w 5d         60  %  LV:        6.2  mm  CM:        8.5  mm  Est. FW:    1687  gm    3 lb 12 oz       9  % ---------------------------------------------------------------------- OB History  Gravidity:    1 ---------------------------------------------------------------------- Gestational Age  LMP:           32w 3d        Date:  01/29/20                 EDD:   11/04/20  U/S Today:     30w 5d                                        EDD:   11/16/20  Best:          Armida Sans 3d     Det. By:  LMP  (01/29/20)          EDD:   11/04/20 ---------------------------------------------------------------------- Anatomy  Cranium:               Appears normal         Aortic Arch:            Not well visualized  Cavum:                 Appears normal         Ductal Arch:            Not well visualized  Ventricles:            Appears normal         Diaphragm:              Appears normal  Choroid Plexus:        Not well visualized    Stomach:                Appears normal, left                                                                        sided  Cerebellum:            Appears normal         Abdomen:                Appears normal  Posterior Fossa:       Appears normal         Abdominal Wall:         Not well visualized  Nuchal Fold:           Not well visualized    Cord Vessels:           Appears normal (3  vessel cord)  Face:                  Orbits nl; profile not Kidneys:                Appear normal                         well visualized  Lips:                  Not well visualized    Bladder:                Appears normal  Thoracic:              Appears normal         Spine:                   Not well visualized  Heart:                 Pericardial            Upper Extremities:      Visualized Limited                         effusion  RVOT:                  Appears normal         Lower Extremities:      Visualized Limited  LVOT:                  Appears normal  Other:  Fetus appears to be female. Technically difficult due to advanced GA          and fetal position. ---------------------------------------------------------------------- Doppler - Fetal Vessels  Umbilical Artery   S/D     %tile      RI    %tile      PI    %tile     PSV    ADFV    RDFV                                                     (cm/s)   2.31       28    1.43   > 97.5    2.08   > 97.5    22.49      No      No ---------------------------------------------------------------------- Cervix Uterus Adnexa  Cervix  Not visualized (advanced GA >24wks)  Uterus  No abnormality visualized.  Right Ovary  Within normal limits. No adnexal mass visualized.  Left Ovary  Within normal limits. No adnexal mass visualized.  Cul De Sac  No free fluid seen.  Adnexa  No abnormality visualized. ---------------------------------------------------------------------- Impression  G4 P3. Patient is here for a second opinion ultrasound.  On  ultrasound performed on 08/14/2020, the estimated fetal  weight was at the 3rd percentile.  On first trimester screening, the risks of fetal aneuploidies  were not increased.  Patient reports no chronic medical  conditions.  Blood pressure today at our office is 118/70  mmHg.  Obstetric history is significant for 2 vaginal deliveries followed  by a cesarean section.  Her children weighed between 5+  and 6+ pounds at birth.  All are  in good health.  On today's ultrasound, amniotic fluid is normal and good fetal  activity seen.  The estimated fetal weight is at the 9th  percentile.  Head circumference measurement is at between -  2 and -1 SD (no evidence of microcephaly).  Mild  physiological pericardial effusion is seen.   Fetal anatomical  survey appears normal but limited by advanced gestational  age.Antenatal testing is reassuring. Umbilical artery Doppler  PI is increased (above the 95th percentile). S/D ratio is  normal. NST is reactive.  BPP 10/10.  I explained the finding of fetal growth restriction based on the  estimated fetal weight.  I also informed her that about 20% of  constitutionally small fetus have similar findings.  However,  we recommend weekly antenatal testing till delivery. ---------------------------------------------------------------------- Recommendations  -NST next week.  -BPP and UA Doppler in 2 weeks and then weekly till delivery. ----------------------------------------------------------------------                  Noralee Space, MD Electronically Signed Final Report   09/12/2020 04:35 pm ----------------------------------------------------------------------    Assessment and Plan: Patient Active Problem List   Diagnosis Date Noted  . Oligohydramnios 09/28/2020  . Pregnancy affected by fetal growth restriction 09/14/2020   Ruled out for rupture, neg pool, neg valsalva, amnisure neg Admit to Antenatal Betamethasone x 2 doses LR @125cc /hr overnight Ordered for repeat BPP in the AM  [name], MD/[facility], [facility]

## 2020-09-29 ENCOUNTER — Observation Stay (HOSPITAL_BASED_OUTPATIENT_CLINIC_OR_DEPARTMENT_OTHER): Payer: Medicaid Other

## 2020-09-29 DIAGNOSIS — O321XX0 Maternal care for breech presentation, not applicable or unspecified: Secondary | ICD-10-CM | POA: Diagnosis not present

## 2020-09-29 DIAGNOSIS — O365930 Maternal care for other known or suspected poor fetal growth, third trimester, not applicable or unspecified: Secondary | ICD-10-CM

## 2020-09-29 DIAGNOSIS — O34219 Maternal care for unspecified type scar from previous cesarean delivery: Secondary | ICD-10-CM

## 2020-09-29 DIAGNOSIS — Z98891 History of uterine scar from previous surgery: Secondary | ICD-10-CM | POA: Diagnosis present

## 2020-09-29 DIAGNOSIS — O4100X0 Oligohydramnios, unspecified trimester, not applicable or unspecified: Secondary | ICD-10-CM

## 2020-09-29 DIAGNOSIS — Z3A34 34 weeks gestation of pregnancy: Secondary | ICD-10-CM

## 2020-09-29 LAB — GC/CHLAMYDIA PROBE AMP (~~LOC~~) NOT AT ARMC
Chlamydia: NEGATIVE
Comment: NEGATIVE
Comment: NORMAL
Neisseria Gonorrhea: NEGATIVE

## 2020-09-29 NOTE — Progress Notes (Signed)
Patient ID: Heidi [name], female   DOB: 11-Jun-1986, 34 y.o.   MRN: [id number] FACULTY PRACTICE ANTEPARTUM(COMPREHENSIVE) NOTE  Heidi [name] is a 34 y.o. G4P3003 at [redacted]w[redacted]d by early ultrasound who is admitted for oligohydramnios.   Fetal presentation is breech. Length of Stay:  0  Days  Subjective:  Patient reports the fetal movement as active. Patient reports uterine contraction  activity as none. Patient reports  vaginal bleeding as none. Patient describes fluid per vagina as None.  Vitals:  Blood pressure 104/61, pulse (!) 119, temperature 98.2 F (36.8 C), temperature source Oral, resp. rate 19, height 5\' 2"  (1.575 m), weight 72.9 kg, last menstrual period 01/29/2020, SpO2 100 %. Physical Examination:  General appearance - alert, well appearing, and in no distress Heart - normal rate and regular rhythm Abdomen - soft, nontender, nondistended Fundal Height:  size equals dates Extremities: extremities normal, atraumatic, no cyanosis or edema and Homans sign is negative, no sign of DVT Membranes:intact  Fetal Monitoring:   Fetal Heart Rate A  Mode External filed at 09/29/2020 0900  Baseline Rate (A) 120 bpm filed at 09/29/2020 0900  Variability 6-25 BPM filed at 09/29/2020 0900  Accelerations 15 x 15 filed at 09/29/2020 0900  Decelerations None filed at 09/29/2020 0900     Labs:  Results for orders placed or performed during the hospital encounter of 09/28/20 (from the past 24 hour(s))  GC/Chlamydia probe amp ([facility])not at [facility]   Collection Time: 09/28/20  6:45 PM  Result Value Ref Range   Chlamydia Negative    Neisseria Gonorrhea Negative    Comment Normal Reference Ranger Chlamydia - Negative    Comment      Normal Reference Range Neisseria Gonorrhea - Negative  Wet prep, genital   Collection Time: 09/28/20  8:14 PM  Result Value Ref Range   Yeast Wet Prep HPF POC NONE SEEN NONE SEEN   Trich, Wet Prep NONE SEEN NONE SEEN   Clue Cells Wet Prep HPF POC NONE SEEN  NONE SEEN   WBC, Wet Prep HPF POC MANY (A) NONE SEEN   Sperm NONE SEEN   Amnisure rupture of membrane (rom)not at [facility]   Collection Time: 09/28/20  8:14 PM  Result Value Ref Range   Amnisure ROM NEGATIVE   CBC on admission   Collection Time: 09/28/20  8:51 PM  Result Value Ref Range   WBC 10.2 4.0 - 10.5 K/uL   RBC 3.49 (L) 3.87 - 5.11 MIL/uL   Hemoglobin 10.4 (L) 12.0 - 15.0 g/dL   HCT 14/02/21 (L) 36 - 46 %   MCV 87.4 80.0 - 100.0 fL   MCH 29.8 26.0 - 34.0 pg   MCHC 34.1 30.0 - 36.0 g/dL   RDW 75.1 70.0 - 17.4 %   Platelets 200 150 - 400 K/uL   nRBC 0.0 0.0 - 0.2 %  Type and screen [facility]   Collection Time: 09/28/20  8:51 PM  Result Value Ref Range   ABO/RH(D) A POS    Antibody Screen NEG    Sample Expiration      10/01/2020,2359 Performed at [facility], [address], [place], [place]   Resp Panel by RT-PCR (Flu A&B, Covid) Nasopharyngeal Swab   Collection Time: 09/28/20  9:14 PM   Specimen: Nasopharyngeal Swab; Nasopharyngeal(NP) swabs in vial transport medium  Result Value Ref Range   SARS Coronavirus 2 by RT PCR NEGATIVE NEGATIVE   Influenza A by PCR NEGATIVE NEGATIVE  Influenza B by PCR NEGATIVE NEGATIVE  Culture, beta strep (group b only)   Collection Time: 09/28/20 10:01 PM   Specimen: Vaginal/Rectal; Genital  Result Value Ref Range   Specimen Description VAGINAL/RECTAL    Special Requests NONE    Culture      CULTURE REINCUBATED FOR BETTER GROWTH Performed at [facility], [address], [place], [place] [address]    Report Status PENDING      Medications:  Scheduled . betamethasone acetate-betamethasone sodium phosphate  12 mg Intramuscular Q24 Hr x 2  . docusate sodium  100 mg Oral Daily  . prenatal multivitamin  1 tablet Oral Q1200   I have reviewed the patient's current medications.  ASSESSMENT: Patient Active Problem List   Diagnosis Date Noted  . History of cesarean delivery affecting pregnancy  09/29/2020  . Oligohydramnios 09/28/2020  . Pregnancy affected by fetal growth restriction 09/14/2020    PLAN: Reviewed Dr. Loretta Plume note and he recommends delivery by cesarean section due to breech after adequate steroid dosing, will schedule per Dr. Earlene Plater who is on call tonight  Scheryl Darter 09/29/2020,5:44 PM

## 2020-09-29 NOTE — Consult Note (Signed)
MFM Note  This patient has been hospitalized due to an IUGR fetus with oligohydramnios.  She has ruled out for rupture of membranes.  Due to oligohydramnios, she is receiving a complete course of antenatal corticosteroids.  A biophysical profile performed today was 6 out of 8.  She received a -2 for as a two cm pocket of amniotic fluid was not present today.  Due to oligohydramnios at her current gestational age (she will be 35 weeks tomorrow), I would recommend delivery once she completes her steroid course.    The patient understands that she will most likely require a cesarean delivery as the fetus remains in the breech presentation.  The patient is agreeable to this plan.

## 2020-09-29 NOTE — Progress Notes (Signed)
Faculty Note  Reviewed case with Dr. Parke Poisson of MFM, recommendation for proceeding with RCS given oligohydramnios. She will be steroid complete tomorrow evening, will plan for repeat c-section 10/01/20 am.   Baldemar Lenis, M.D. Attending [facility] [employer] [facility])

## 2020-09-30 ENCOUNTER — Encounter (HOSPITAL_COMMUNITY): Payer: Self-pay | Admitting: Obstetrics and Gynecology

## 2020-09-30 ENCOUNTER — Inpatient Hospital Stay (HOSPITAL_COMMUNITY): Payer: Medicaid Other | Admitting: Certified Registered Nurse Anesthetist

## 2020-09-30 ENCOUNTER — Encounter (HOSPITAL_COMMUNITY)
Admission: AD | Disposition: A | Discharge status: Home / Self Care | Payer: Self-pay | Source: Home / Self Care | Attending: Obstetrics and Gynecology

## 2020-09-30 DIAGNOSIS — O34219 Maternal care for unspecified type scar from previous cesarean delivery: Secondary | ICD-10-CM

## 2020-09-30 DIAGNOSIS — O321XX0 Maternal care for breech presentation, not applicable or unspecified: Secondary | ICD-10-CM | POA: Diagnosis present

## 2020-09-30 DIAGNOSIS — Z3A35 35 weeks gestation of pregnancy: Secondary | ICD-10-CM

## 2020-09-30 DIAGNOSIS — O365930 Maternal care for other known or suspected poor fetal growth, third trimester, not applicable or unspecified: Secondary | ICD-10-CM

## 2020-09-30 DIAGNOSIS — Z23 Encounter for immunization: Secondary | ICD-10-CM | POA: Diagnosis not present

## 2020-09-30 DIAGNOSIS — Z20822 Contact with and (suspected) exposure to covid-19: Secondary | ICD-10-CM | POA: Diagnosis present

## 2020-09-30 DIAGNOSIS — O365990 Maternal care for other known or suspected poor fetal growth, unspecified trimester, not applicable or unspecified: Secondary | ICD-10-CM

## 2020-09-30 DIAGNOSIS — Z98891 History of uterine scar from previous surgery: Secondary | ICD-10-CM | POA: Diagnosis not present

## 2020-09-30 DIAGNOSIS — Z3A34 34 weeks gestation of pregnancy: Secondary | ICD-10-CM | POA: Diagnosis not present

## 2020-09-30 DIAGNOSIS — Z87898 Personal history of other specified conditions: Secondary | ICD-10-CM

## 2020-09-30 DIAGNOSIS — O4103X0 Oligohydramnios, third trimester, not applicable or unspecified: Secondary | ICD-10-CM | POA: Diagnosis present

## 2020-09-30 DIAGNOSIS — Z8759 Personal history of other complications of pregnancy, childbirth and the puerperium: Secondary | ICD-10-CM | POA: Diagnosis not present

## 2020-09-30 DIAGNOSIS — O34211 Maternal care for low transverse scar from previous cesarean delivery: Secondary | ICD-10-CM | POA: Diagnosis present

## 2020-09-30 DIAGNOSIS — O4100X0 Oligohydramnios, unspecified trimester, not applicable or unspecified: Secondary | ICD-10-CM | POA: Diagnosis present

## 2020-09-30 LAB — CBC
HCT: 28.2 % — ABNORMAL LOW (ref 36.0–46.0)
Hemoglobin: 9.8 g/dL — ABNORMAL LOW (ref 12.0–15.0)
MCH: 30.7 pg (ref 26.0–34.0)
MCHC: 34.8 g/dL (ref 30.0–36.0)
MCV: 88.4 fL (ref 80.0–100.0)
Platelets: 152 10*3/uL (ref 150–400)
RBC: 3.19 MIL/uL — ABNORMAL LOW (ref 3.87–5.11)
RDW: 12.8 % (ref 11.5–15.5)
WBC: 10.2 10*3/uL (ref 4.0–10.5)
nRBC: 0 % (ref 0.0–0.2)

## 2020-09-30 LAB — CULTURE, BETA STREP (GROUP B ONLY)

## 2020-09-30 SURGERY — Surgical Case
Anesthesia: Spinal

## 2020-09-30 MED ORDER — DIPHENHYDRAMINE HCL 25 MG PO CAPS: 25.0000 mg | ORAL_CAPSULE | ORAL | Status: DC | PRN

## 2020-09-30 MED ORDER — PHENYLEPHRINE HCL-NACL 20-0.9 MG/250ML-% IV SOLN
INTRAVENOUS | Status: AC
  Filled 2020-09-30: qty 250

## 2020-09-30 MED ORDER — SIMETHICONE 80 MG PO CHEW
80.0000 mg | CHEWABLE_TABLET | ORAL | Status: DC
  Administered 2020-10-01 – 2020-10-02 (×3): 80 mg via ORAL
  Filled 2020-09-30 (×3): qty 1

## 2020-09-30 MED ORDER — KETOROLAC TROMETHAMINE 30 MG/ML IJ SOLN
INTRAMUSCULAR | Status: AC
  Filled 2020-09-30: qty 1

## 2020-09-30 MED ORDER — DEXAMETHASONE SODIUM PHOSPHATE 4 MG/ML IJ SOLN
INTRAMUSCULAR | Status: AC
  Filled 2020-09-30: qty 1

## 2020-09-30 MED ORDER — DIBUCAINE (PERIANAL) 1 % EX OINT: 1.0000 "application " | TOPICAL_OINTMENT | CUTANEOUS | Status: DC | PRN

## 2020-09-30 MED ORDER — NALBUPHINE HCL 10 MG/ML IJ SOLN: 5.0000 mg | Freq: Once | INTRAMUSCULAR | Status: DC | PRN

## 2020-09-30 MED ORDER — PRENATAL MULTIVITAMIN CH
1.0000 | ORAL_TABLET | Freq: Every day | ORAL | Status: DC
  Administered 2020-10-01 – 2020-10-02 (×2): 1 via ORAL
  Filled 2020-09-30 (×2): qty 1

## 2020-09-30 MED ORDER — ONDANSETRON HCL 4 MG/2ML IJ SOLN: 4.0000 mg | Freq: Three times a day (TID) | INTRAMUSCULAR | Status: DC | PRN

## 2020-09-30 MED ORDER — MEPERIDINE HCL 25 MG/ML IJ SOLN: 6.2500 mg | INTRAMUSCULAR | Status: DC | PRN

## 2020-09-30 MED ORDER — ENOXAPARIN SODIUM 40 MG/0.4ML ~~LOC~~ SOLN
40.0000 mg | SUBCUTANEOUS | Status: DC
  Administered 2020-10-01 – 2020-10-03 (×3): 40 mg via SUBCUTANEOUS
  Filled 2020-09-30 (×3): qty 0.4

## 2020-09-30 MED ORDER — ACETAMINOPHEN 500 MG PO TABS
1000.0000 mg | ORAL_TABLET | Freq: Four times a day (QID) | ORAL | Status: AC
  Administered 2020-10-01 (×2): 1000 mg via ORAL
  Filled 2020-09-30 (×4): qty 2

## 2020-09-30 MED ORDER — ONDANSETRON HCL 4 MG/2ML IJ SOLN
INTRAMUSCULAR | Status: AC
  Filled 2020-09-30: qty 2

## 2020-09-30 MED ORDER — NALBUPHINE HCL 10 MG/ML IJ SOLN: 5.0000 mg | INTRAMUSCULAR | Status: DC | PRN

## 2020-09-30 MED ORDER — DEXMEDETOMIDINE (PRECEDEX) IN NS 20 MCG/5ML (4 MCG/ML) IV SYRINGE
PREFILLED_SYRINGE | INTRAVENOUS | Status: DC | PRN
  Administered 2020-09-30: 8 ug via INTRAVENOUS
  Administered 2020-09-30: 4 ug via INTRAVENOUS

## 2020-09-30 MED ORDER — MENTHOL 3 MG MT LOZG: 1.0000 | LOZENGE | OROMUCOSAL | Status: DC | PRN

## 2020-09-30 MED ORDER — KETOROLAC TROMETHAMINE 30 MG/ML IJ SOLN: 30.0000 mg | Freq: Four times a day (QID) | INTRAMUSCULAR | Status: AC | PRN

## 2020-09-30 MED ORDER — DIPHENHYDRAMINE HCL 25 MG PO CAPS: 25.0000 mg | ORAL_CAPSULE | Freq: Four times a day (QID) | ORAL | Status: DC | PRN

## 2020-09-30 MED ORDER — LACTATED RINGERS IV SOLN: INTRAVENOUS | Status: DC

## 2020-09-30 MED ORDER — SODIUM CHLORIDE 0.9% FLUSH: 3.0000 mL | INTRAVENOUS | Status: DC | PRN

## 2020-09-30 MED ORDER — SIMETHICONE 80 MG PO CHEW: 80.0000 mg | CHEWABLE_TABLET | ORAL | Status: DC | PRN

## 2020-09-30 MED ORDER — NALOXONE HCL 4 MG/10ML IJ SOLN
1.0000 ug/kg/h | INTRAVENOUS | Status: DC | PRN
  Filled 2020-09-30: qty 5

## 2020-09-30 MED ORDER — NALOXONE HCL 0.4 MG/ML IJ SOLN: 0.4000 mg | INTRAMUSCULAR | Status: DC | PRN

## 2020-09-30 MED ORDER — CEFAZOLIN SODIUM-DEXTROSE 2-4 GM/100ML-% IV SOLN
2.0000 g | INTRAVENOUS | Status: AC
  Administered 2020-09-30: 2 g via INTRAVENOUS
  Filled 2020-09-30: qty 100

## 2020-09-30 MED ORDER — SCOPOLAMINE 1 MG/3DAYS TD PT72
MEDICATED_PATCH | TRANSDERMAL | Status: AC
  Filled 2020-09-30: qty 1

## 2020-09-30 MED ORDER — DEXMEDETOMIDINE (PRECEDEX) IN NS 20 MCG/5ML (4 MCG/ML) IV SYRINGE
PREFILLED_SYRINGE | INTRAVENOUS | Status: AC
  Filled 2020-09-30: qty 5

## 2020-09-30 MED ORDER — DEXAMETHASONE SODIUM PHOSPHATE 4 MG/ML IJ SOLN
INTRAMUSCULAR | Status: DC | PRN
  Administered 2020-09-30: 4 mg via INTRAVENOUS

## 2020-09-30 MED ORDER — OXYCODONE HCL 5 MG PO TABS: 5.0000 mg | ORAL_TABLET | ORAL | Status: DC | PRN

## 2020-09-30 MED ORDER — MORPHINE SULFATE (PF) 0.5 MG/ML IJ SOLN
INTRAMUSCULAR | Status: DC | PRN
  Administered 2020-09-30: 150 ug via INTRATHECAL

## 2020-09-30 MED ORDER — ONDANSETRON HCL 4 MG/2ML IJ SOLN
INTRAMUSCULAR | Status: DC | PRN
  Administered 2020-09-30: 4 mg via INTRAVENOUS

## 2020-09-30 MED ORDER — WITCH HAZEL-GLYCERIN EX PADS: 1.0000 "application " | MEDICATED_PAD | CUTANEOUS | Status: DC | PRN

## 2020-09-30 MED ORDER — FENTANYL CITRATE (PF) 100 MCG/2ML IJ SOLN: 25.0000 ug | INTRAMUSCULAR | Status: DC | PRN

## 2020-09-30 MED ORDER — STERILE WATER FOR IRRIGATION IR SOLN
Status: DC | PRN
  Administered 2020-09-30: 1

## 2020-09-30 MED ORDER — SENNOSIDES-DOCUSATE SODIUM 8.6-50 MG PO TABS
2.0000 | ORAL_TABLET | ORAL | Status: DC
  Administered 2020-10-01 – 2020-10-02 (×2): 2 via ORAL
  Filled 2020-09-30 (×3): qty 2

## 2020-09-30 MED ORDER — ACETAMINOPHEN 500 MG PO TABS
1000.0000 mg | ORAL_TABLET | Freq: Three times a day (TID) | ORAL | Status: DC
  Administered 2020-09-30 – 2020-10-03 (×5): 1000 mg via ORAL
  Filled 2020-09-30 (×6): qty 2

## 2020-09-30 MED ORDER — LACTATED RINGERS IV SOLN: INTRAVENOUS | Status: DC | PRN

## 2020-09-30 MED ORDER — OXYTOCIN-SODIUM CHLORIDE 30-0.9 UT/500ML-% IV SOLN
INTRAVENOUS | Status: DC | PRN
  Administered 2020-09-30: 50 mL via INTRAVENOUS
  Administered 2020-09-30: 500 mL via INTRAVENOUS

## 2020-09-30 MED ORDER — SOD CITRATE-CITRIC ACID 500-334 MG/5ML PO SOLN
30.0000 mL | ORAL | Status: AC
  Administered 2020-09-30: 30 mL via ORAL
  Filled 2020-09-30: qty 15

## 2020-09-30 MED ORDER — SODIUM CHLORIDE 0.9 % IR SOLN
Status: DC | PRN
  Administered 2020-09-30: 1000 mL

## 2020-09-30 MED ORDER — IBUPROFEN 800 MG PO TABS
800.0000 mg | ORAL_TABLET | Freq: Four times a day (QID) | ORAL | Status: DC
  Administered 2020-10-01 – 2020-10-03 (×6): 800 mg via ORAL
  Filled 2020-09-30 (×7): qty 1

## 2020-09-30 MED ORDER — OXYTOCIN-SODIUM CHLORIDE 30-0.9 UT/500ML-% IV SOLN
INTRAVENOUS | Status: AC
  Filled 2020-09-30: qty 500

## 2020-09-30 MED ORDER — COCONUT OIL OIL: 1.0000 "application " | TOPICAL_OIL | Status: DC | PRN

## 2020-09-30 MED ORDER — SIMETHICONE 80 MG PO CHEW
80.0000 mg | CHEWABLE_TABLET | Freq: Three times a day (TID) | ORAL | Status: DC
  Administered 2020-09-30 – 2020-10-03 (×8): 80 mg via ORAL
  Filled 2020-09-30 (×8): qty 1

## 2020-09-30 MED ORDER — KETOROLAC TROMETHAMINE 30 MG/ML IJ SOLN
30.0000 mg | Freq: Four times a day (QID) | INTRAMUSCULAR | Status: AC
  Administered 2020-10-01 (×3): 30 mg via INTRAVENOUS
  Filled 2020-09-30 (×3): qty 1

## 2020-09-30 MED ORDER — MORPHINE SULFATE (PF) 0.5 MG/ML IJ SOLN
INTRAMUSCULAR | Status: AC
  Filled 2020-09-30: qty 10

## 2020-09-30 MED ORDER — FENTANYL CITRATE (PF) 100 MCG/2ML IJ SOLN
INTRAMUSCULAR | Status: DC | PRN
Start: 2020-09-30 — End: 2020-09-30
  Administered 2020-09-30: 25 ug via INTRAVENOUS
  Administered 2020-09-30: 35 ug via INTRAVENOUS
  Administered 2020-09-30: 25 ug via INTRAVENOUS

## 2020-09-30 MED ORDER — OXYTOCIN-SODIUM CHLORIDE 30-0.9 UT/500ML-% IV SOLN
2.5000 [IU]/h | INTRAVENOUS | Status: AC
  Administered 2020-09-30 (×2): 2.5 [IU]/h via INTRAVENOUS
  Filled 2020-09-30: qty 500

## 2020-09-30 MED ORDER — FENTANYL CITRATE (PF) 100 MCG/2ML IJ SOLN
INTRAMUSCULAR | Status: AC
  Filled 2020-09-30: qty 2

## 2020-09-30 MED ORDER — BUPIVACAINE IN DEXTROSE 0.75-8.25 % IT SOLN
INTRATHECAL | Status: DC | PRN
  Administered 2020-09-30: 1.6 mL via INTRATHECAL

## 2020-09-30 MED ORDER — KETOROLAC TROMETHAMINE 30 MG/ML IJ SOLN
30.0000 mg | Freq: Four times a day (QID) | INTRAMUSCULAR | Status: AC | PRN
  Administered 2020-09-30: 30 mg via INTRAVENOUS

## 2020-09-30 MED ORDER — FENTANYL CITRATE (PF) 100 MCG/2ML IJ SOLN
INTRAMUSCULAR | Status: DC | PRN
  Administered 2020-09-30: 15 ug via INTRATHECAL

## 2020-09-30 MED ORDER — SCOPOLAMINE 1 MG/3DAYS TD PT72
1.0000 | MEDICATED_PATCH | Freq: Once | TRANSDERMAL | Status: AC
  Administered 2020-09-30: 1.5 mg via TRANSDERMAL

## 2020-09-30 MED ORDER — PHENYLEPHRINE HCL-NACL 20-0.9 MG/250ML-% IV SOLN
INTRAVENOUS | Status: DC | PRN
  Administered 2020-09-30: 60 ug/min via INTRAVENOUS

## 2020-09-30 MED ORDER — DIPHENHYDRAMINE HCL 50 MG/ML IJ SOLN: 12.5000 mg | INTRAMUSCULAR | Status: DC | PRN

## 2020-09-30 MED ORDER — TETANUS-DIPHTH-ACELL PERTUSSIS 5-2.5-18.5 LF-MCG/0.5 IM SUSY
0.5000 mL | PREFILLED_SYRINGE | Freq: Once | INTRAMUSCULAR | Status: AC
  Administered 2020-10-01: 0.5 mL via INTRAMUSCULAR
  Filled 2020-09-30: qty 0.5

## 2020-09-30 SURGICAL SUPPLY — 35 items
BENZOIN TINCTURE PRP APPL 2/3 (GAUZE/BANDAGES/DRESSINGS) ×3 IMPLANT
CANISTER SUCT 3000ML PPV (MISCELLANEOUS) ×3 IMPLANT
CHLORAPREP W/TINT 26ML (MISCELLANEOUS) ×3 IMPLANT
CLOSURE STERI STRIP 1/2 X4 (GAUZE/BANDAGES/DRESSINGS) ×2 IMPLANT
CLOSURE WOUND 1/2 X4 (GAUZE/BANDAGES/DRESSINGS) ×1
DRSG OPSITE POSTOP 4X10 (GAUZE/BANDAGES/DRESSINGS) ×3 IMPLANT
ELECT REM PT RETURN 9FT ADLT (ELECTROSURGICAL) ×3
ELECTRODE REM PT RTRN 9FT ADLT (ELECTROSURGICAL) ×1 IMPLANT
EXTRACTOR VACUUM KIWI (MISCELLANEOUS) ×3 IMPLANT
GLOVE BIOGEL PI IND STRL 7.0 (GLOVE) ×2 IMPLANT
GLOVE BIOGEL PI IND STRL 7.5 (GLOVE) ×1 IMPLANT
GLOVE BIOGEL PI INDICATOR 7.0 (GLOVE) ×4
GLOVE BIOGEL PI INDICATOR 7.5 (GLOVE) ×2
GLOVE SKINSENSE NS SZ7.0 (GLOVE) ×2
GLOVE SKINSENSE STRL SZ7.0 (GLOVE) ×1 IMPLANT
GOWN STRL REUS W/ TWL LRG LVL3 (GOWN DISPOSABLE) ×2 IMPLANT
GOWN STRL REUS W/ TWL XL LVL3 (GOWN DISPOSABLE) ×1 IMPLANT
GOWN STRL REUS W/TWL LRG LVL3 (GOWN DISPOSABLE) ×4
GOWN STRL REUS W/TWL XL LVL3 (GOWN DISPOSABLE) ×2
NS IRRIG 1000ML POUR BTL (IV SOLUTION) ×3 IMPLANT
PACK C SECTION WH (CUSTOM PROCEDURE TRAY) ×3 IMPLANT
PAD ABD 7.5X8 STRL (GAUZE/BANDAGES/DRESSINGS) ×3 IMPLANT
PAD OB MATERNITY 4.3X12.25 (PERSONAL CARE ITEMS) ×3 IMPLANT
PAD PREP 24X48 CUFFED NSTRL (MISCELLANEOUS) ×3 IMPLANT
PENCIL SMOKE EVAC W/HOLSTER (ELECTROSURGICAL) ×3 IMPLANT
STRIP CLOSURE SKIN 1/2X4 (GAUZE/BANDAGES/DRESSINGS) ×2 IMPLANT
SUT MNCRL 0 VIOLET CTX 36 (SUTURE) ×2 IMPLANT
SUT MON AB 4-0 PS1 27 (SUTURE) ×3 IMPLANT
SUT MONOCRYL 0 CTX 36 (SUTURE) ×4
SUT PLAIN 2 0 XLH (SUTURE) ×3 IMPLANT
SUT VIC AB 0 CT1 36 (SUTURE) ×9 IMPLANT
SUT VIC AB 3-0 CT1 27 (SUTURE) ×2
SUT VIC AB 3-0 CT1 TAPERPNT 27 (SUTURE) ×1 IMPLANT
TOWEL OR 17X24 6PK STRL BLUE (TOWEL DISPOSABLE) ×6 IMPLANT
WATER STERILE IRR 1000ML POUR (IV SOLUTION) ×3 IMPLANT

## 2020-09-30 NOTE — Anesthesia Procedure Notes (Signed)
Spinal  Patient location during procedure: OR Start time: 09/30/2020 1:23 PM End time: 09/30/2020 1:28 PM Staffing Performed: anesthesiologist  Anesthesiologist: Marcene Duos, MD Preanesthetic Checklist Completed: patient identified, IV checked, site marked, risks and benefits discussed, surgical consent, monitors and equipment checked, pre-op evaluation and timeout performed Spinal Block Patient position: sitting Prep: DuraPrep Patient monitoring: heart rate, cardiac monitor, continuous pulse ox and blood pressure Approach: midline Location: L4-5 Injection technique: single-shot Needle Needle type: Pencan  Needle gauge: 24 G Needle length: 9 cm Assessment Sensory level: T4

## 2020-09-30 NOTE — Discharge Summary (Signed)
Postpartum Discharge Summary      Patient Name: Heidi [name] DOB: [birth year] MRN: [id number]  Date of admission: 09/28/2020 Delivery date:09/30/2020  Delivering provider: Aletha Halim  Date of discharge: 10/03/2020  Admitting diagnosis: Oligohydramnios [O41.00X0] Oligohydramnios antepartum [O41.00X0] Intrauterine pregnancy: [redacted]w[redacted]d    Secondary diagnosis:  Active Problems:   Oligohydramnios   History of cesarean delivery affecting pregnancy   IUGR (intrauterine growth restriction) affecting care of mother   Oligohydramnios antepartum   Cesarean delivery delivered     Discharge diagnosis: Preterm Pregnancy Delivered                                              Post partum procedures:none Augmentation: N/A Complications: None  Hospital course: Sceduled C/S   34y.o. yo [id number] admitted to the hospital 09/28/2020 for FGR and severe oligohydramnios, scheduled for scheduled cesarean section with the following indication:Malpresentation.Delivery details are as follows:  Membrane Rupture Time/Date: 1:50 PM ,09/30/2020   Delivery Method:C-Section, Low Transverse  Details of operation can be found in separate operative note.  Patient had an uncomplicated postpartum course.  She is ambulating, tolerating a regular diet, passing flatus, and urinating well. Patient is discharged home in stable condition on  10/03/20        Newborn Data: Birth date:09/30/2020  Birth time:1:51 PM  Gender:Female  Living status:Living  Apgars:9 ,9  Weight:2050 g     Magnesium Sulfate received: No BMZ received: Yes Rhophylac:N/A MMR:N/A Transfusion:No  Physical exam  Vitals:   10/02/20 1323 10/02/20 1952 10/03/20 0440 10/03/20 0810  BP: (!) 108/58 105/68 104/71 112/70  Pulse: 86 88 81 88  Resp:  _0 Temp: 98.6 F (37 C) 97.8 F (36.6 C) 98.1 F (36.7 C) 97.6 F (36.4 C)  TempSrc: Oral Oral Oral Oral  SpO2: 100% 100% 100% 100%  Weight:      Height:       General:  alert, cooperative and no distress Lochia: appropriate Uterine Fundus: firm Incision: Dressing is clean, dry, and intact DVT Evaluation: No evidence of DVT seen on physical exam. Labs: Lab Results  Component Value Date   WBC 14.6 (H) 10/01/2020   HGB 8.8 (L) 10/01/2020   HCT 26.6 (L) 10/01/2020   MCV 91.1 10/01/2020   PLT 198 10/01/2020   No flowsheet data found. Edinburgh Score: Edinburgh Postnatal Depression Scale Screening Tool 10/01/2020  I have been able to laugh and see the funny side of things. 0  I have looked forward with enjoyment to things. 1  I have blamed myself unnecessarily when things went wrong. 2  I have been anxious or worried for no good reason. 2  I have felt scared or panicky for no good reason. 0  Things have been getting on top of me. 1  I have been so unhappy that I have had difficulty sleeping. 0  I have felt sad or miserable. 0  I have been so unhappy that I have been crying. 0  The thought of harming myself has occurred to me. 0  Edinburgh Postnatal Depression Scale Total 6     After visit meds:  Allergies as of 10/03/2020   No Known Allergies     Medication List    STOP taking these medications   HYDROcodone-acetaminophen 5-325 MG tablet Commonly known as: NORCO/VICODIN  TAKE these medications   ferrous sulfate 325 (65 FE) MG tablet Take 1 tablet (325 mg total) by mouth daily with breakfast. Start taking on: October 04, 2020   ibuprofen 800 MG tablet Commonly known as: ADVIL Take 1 tablet (800 mg total) by mouth every 6 (six) hours.   oxyCODONE 5 MG immediate release tablet Commonly known as: Oxy IR/ROXICODONE Take 1 tablet (5 mg total) by mouth every 4 (four) hours as needed for moderate pain.   PRENATAL VITAMIN PO Take 1 tablet by mouth daily.        Discharge home in stable condition Infant Feeding: Breast Infant Disposition:NICU Discharge instruction: per After Visit Summary and Postpartum booklet. Activity: Advance  as tolerated. Pelvic rest for 6 weeks.  Diet: routine diet Future Appointments: Future Appointments  Date Time Provider [place]  10/09/2020  2:00 PM [place] [facility]   Follow up Visit:  [place] [facility] Follow up on 10/09/2020.   Specialty: Obstetrics and Gynecology Why: As scheduled for incision check Contact information: [place] [id number] [phone]             Patient informed to make follow up appointment at [facility], message sent to [facility] for one week incision check   Please schedule this patient for a In person postpartum visit in 6 weeks with the following provider: Any provider. Additional Postpartum F/U:Incision check 1 week  High risk pregnancy complicated by: repeat CS, oligohydramnios Delivery mode:  C-Section, Low Transverse  Anticipated Birth Control:  Unsure   10/03/2020 Mora Bellman, MD

## 2020-09-30 NOTE — Transfer of Care (Signed)
Immediate Anesthesia Transfer of Care Note  Patient: Heidi [name]  Procedure(s) Performed: CESAREAN SECTION (N/A )  Patient Location: PACU  Anesthesia Type:Spinal  Level of Consciousness: awake, alert  and patient cooperative  Airway & Oxygen Therapy: Patient Spontanous Breathing  Post-op Assessment: Report given to RN, Post -op Vital signs reviewed and stable and Patient moving all extremities X 4  Post vital signs: Reviewed and stable  Last Vitals:  Vitals Value Taken Time  BP 103/57 09/30/20 1500  Temp    Pulse 74 09/30/20 1502  Resp 25 09/30/20 1502  SpO2 95 % 09/30/20 1502  Vitals shown include unvalidated device data.  Last Pain:  Vitals:   09/30/20 1218  TempSrc: Oral  PainSc:       Patients Stated Pain Goal: 3 (09/29/20 0826)  Complications: No complications documented.

## 2020-09-30 NOTE — Progress Notes (Addendum)
FACULTY PRACTICE ANTEPARTUM PROGRESS NOTE  Heidi [name] is a 34 y.o. D3U2025 at [redacted]w[redacted]d who is admitted for oligohydramnios.  Estimated Date of Delivery: 11/04/20 Fetal presentation is breech.  Length of Stay:  0 Days. Admitted 09/28/2020  Subjective:  Patient reports normal fetal movement.  She denies uterine contractions, denies bleeding and leaking of fluid per vagina.  Vitals:  Blood pressure (!) 91/54, pulse 89, temperature 98.3 F (36.8 C), temperature source Oral, resp. rate 18, height 5\' 2"  (1.575 m), weight 72.9 kg, last menstrual period 01/29/2020, SpO2 99 %. Physical Examination: CONSTITUTIONAL: Well-developed, well-nourished female in no acute distress.  HENT:  Normocephalic, atraumatic, External right and left ear normal. Oropharynx is clear and moist EYES: Conjunctivae and EOM are normal. Pupils are equal, round, and reactive to light. No scleral icterus.  NECK: Normal range of motion, supple, no masses. SKIN: Skin is warm and dry. No rash noted. Not diaphoretic. No erythema. No pallor. NEUROLGIC: Alert and oriented to person, place, and time. Normal reflexes, muscle tone coordination. No cranial nerve deficit noted. PSYCHIATRIC: Normal mood and affect. Normal behavior. Normal judgment and thought content. CARDIOVASCULAR: Normal heart rate noted RESPIRATORY: Effort normal, no problems with respiration noted MUSCULOSKELETAL: Normal range of motion. No edema and no tenderness. ABDOMEN: Soft, nontender, nondistended, gravid. CERVIX: deferred  Fetal monitoring: FHR: 120 bpm, Variability: moderate, Accelerations: Present, Decelerations: Absent  Uterine activity: no contractions per hour  Results for orders placed or performed during the hospital encounter of 09/28/20 (from the past 48 hour(s))  GC/Chlamydia probe amp ([place])not at [facility]     Status: None   Collection Time: 09/28/20  6:45 PM  Result Value Ref Range   Chlamydia Negative    Neisseria Gonorrhea Negative     Comment Normal Reference [place] Chlamydia - Negative    Comment      Normal Reference Range Neisseria Gonorrhea - Negative  Amnisure rupture of membrane (rom)not at [facility]     Status: None   Collection Time: 09/28/20  8:14 PM  Result Value Ref Range   Amnisure ROM NEGATIVE     Comment: Performed at [facility], [address], [place], [place]  Wet prep, genital     Status: Abnormal   Collection Time: 09/28/20  8:14 PM  Result Value Ref Range   Yeast Wet Prep HPF POC NONE SEEN NONE SEEN   Trich, Wet Prep NONE SEEN NONE SEEN   Clue Cells Wet Prep HPF POC NONE SEEN NONE SEEN   WBC, Wet Prep HPF POC MANY (A) NONE SEEN   Sperm NONE SEEN     Comment: Performed at [facility], [address] [place], [place]  Type and screen [facility]     Status: None   Collection Time: 09/28/20  8:51 PM  Result Value Ref Range   ABO/RH(D) A POS    Antibody Screen NEG    Sample Expiration      10/01/2020,2359 Performed at [facility], [address], [place], [place]   CBC on admission     Status: Abnormal   Collection Time: 09/28/20  8:51 PM  Result Value Ref Range   WBC 10.2 4.0 - 10.5 K/uL   RBC 3.49 (L) 3.87 - 5.11 MIL/uL   Hemoglobin 10.4 (L) 12.0 - 15.0 g/dL   HCT 14/02/21 (L) 36 - 46 %   MCV 87.4 80.0 - 100.0 fL   MCH 29.8 26.0 - 34.0 pg  MCHC 34.1 30.0 - 36.0 g/dL   RDW 69.4 85.4 - 62.7 %   Platelets 200 150 - 400 K/uL   nRBC 0.0 0.0 - 0.2 %    Comment: Performed at [facility], [address], [place], [place] [address]  Resp Panel by RT-PCR (Flu A&B, Covid) Nasopharyngeal Swab     Status: None   Collection Time: 09/28/20  9:14 PM   Specimen: Nasopharyngeal Swab; Nasopharyngeal(NP) swabs in vial transport medium  Result Value Ref Range   SARS Coronavirus 2 by RT PCR NEGATIVE NEGATIVE    Comment: (NOTE) SARS-CoV-2 target nucleic acids are NOT DETECTED.  The SARS-CoV-2 RNA is generally detectable in upper  respiratory specimens during the acute phase of infection. The lowest concentration of SARS-CoV-2 viral copies this assay can detect is 138 copies/mL. A negative result does not preclude SARS-Cov-2 infection and should not be used as the sole basis for treatment or other patient management decisions. A negative result may occur with  improper specimen collection/handling, submission of specimen other than nasopharyngeal swab, presence of viral mutation(s) within the areas targeted by this assay, and inadequate number of viral copies(<138 copies/mL). A negative result must be combined with clinical observations, patient history, and epidemiological information. The expected result is Negative.  Fact Sheet for Patients:  BloggerCourse.com  Fact Sheet for Healthcare Providers:  SeriousBroker.it  This test is no t yet approved or cleared by the [place] FDA and  has been authorized for detection and/or diagnosis of SARS-CoV-2 by FDA under an Emergency Use Authorization (EUA). This EUA will remain  in effect (meaning this test can be used) for the duration of the COVID-19 declaration under Section 564(b)(1) of the Act, 21 U.S.C.section 360bbb-3(b)(1), unless the authorization is terminated  or revoked sooner.       Influenza A by PCR NEGATIVE NEGATIVE   Influenza B by PCR NEGATIVE NEGATIVE    Comment: (NOTE) The Xpert Xpress SARS-CoV-2/FLU/RSV plus assay is intended as an aid in the diagnosis of influenza from Nasopharyngeal swab specimens and should not be used as a sole basis for treatment. Nasal washings and aspirates are unacceptable for Xpert Xpress SARS-CoV-2/FLU/RSV testing.  Fact Sheet for Patients: BloggerCourse.com  Fact Sheet for Healthcare Providers: SeriousBroker.it  This test is not yet approved or cleared by the [place] FDA and has been authorized for  detection and/or diagnosis of SARS-CoV-2 by FDA under an Emergency Use Authorization (EUA). This EUA will remain in effect (meaning this test can be used) for the duration of the COVID-19 declaration under Section 564(b)(1) of the Act, 21 U.S.C. section 360bbb-3(b)(1), unless the authorization is terminated or revoked.  Performed at [facility], [address], [place], [place] [address]   Culture, beta strep (group b only)     Status: None (Preliminary result)   Collection Time: 09/28/20 10:01 PM   Specimen: Vaginal/Rectal; Genital  Result Value Ref Range   Specimen Description VAGINAL/RECTAL    Special Requests NONE    Culture      CULTURE REINCUBATED FOR BETTER GROWTH Performed at [facility], [address], [place], [place] [address]    Report Status PENDING     I have reviewed the patient's current medications.  ASSESSMENT: Active Problems:   Oligohydramnios   History of cesarean delivery affecting pregnancy   IUGR (intrauterine growth restriction) affecting care of mother   PLAN: Maternal-fetal status stable IUGR with growth at 9th%tile Reviewed plan for delivery Sunday for oligo  once she is BTMZ complete NPO after midnight for am RCS for Sunday  Continue routine antenatal care.   Baldemar Lenis, M.D. Attending Center for [employer] (Faculty Practice)  09/30/2020 8:39 AM

## 2020-09-30 NOTE — Anesthesia Preprocedure Evaluation (Signed)
Anesthesia Evaluation  Patient identified by MRN, date of birth, ID band Patient awake    Reviewed: Allergy & Precautions, NPO status , Patient's Chart, lab work & pertinent test results  Airway Mallampati: II  TM Distance: >3 FB     Dental  (+) Dental Advisory Given   Pulmonary neg pulmonary ROS,    breath sounds clear to auscultation       Cardiovascular negative cardio ROS   Rhythm:Regular Rate:Normal     Neuro/Psych negative neurological ROS     GI/Hepatic negative GI ROS, Neg liver ROS,   Endo/Other  negative endocrine ROS  Renal/GU negative Renal ROS     Musculoskeletal   Abdominal   Peds  Hematology negative hematology ROS (+) anemia ,   Anesthesia Other Findings   Reproductive/Obstetrics (+) Pregnancy (repeat c/s 2/2 oligo, FGR, breech, repeat)                             Lab Results  Component Value Date   WBC 10.2 09/30/2020   HGB 9.8 (L) 09/30/2020   HCT 28.2 (L) 09/30/2020   MCV 88.4 09/30/2020   PLT 152 09/30/2020    Anesthesia Physical Anesthesia Plan  ASA: II  Anesthesia Plan: Spinal   Post-op Pain Management:    Induction:   PONV Risk Score and Plan: 2 and Dexamethasone, Ondansetron and Treatment may vary due to age or medical condition  Airway Management Planned: Natural Airway  Additional Equipment:   Intra-op Plan:   Post-operative Plan:   Informed Consent: I have reviewed the patients History and Physical, chart, labs and discussed the procedure including the risks, benefits and alternatives for the proposed anesthesia with the patient or authorized representative who has indicated his/her understanding and acceptance.       Plan Discussed with:   Anesthesia Plan Comments:         Anesthesia Quick Evaluation

## 2020-09-30 NOTE — Op Note (Addendum)
Operative Note   SURGERY DATE: 09/30/2020  PRE-OP DIAGNOSIS:  *Pregnancy at 35/0 *Breech presentation *Oligohydramnios *FGR at the 9% *History of cesarean section  POST-OP DIAGNOSIS: Same. Delivered   PROCEDURE: Repeat low transverse cesarean section via pfannenstiel skin incision with double layer uterine closure  SURGEON: *  Bing, MD  ASSISTANT:    Myriam Jacobson, Arlana Pouch, MD - Fellow  ANESTHESIA: spinal  ESTIMATED BLOOD LOSS:  DRAINS: UOP via indwelling foley  TOTAL IV FLUIDS: crystalloid  VTE PROPHYLAXIS: SCDs to bilateral lower extremities  ANTIBIOTICS: Two grams of Cefazolin were given., within 1 hour of skin incision  SPECIMENS: placenta and arterial and venous cord gases  COMPLICATIONS: None  FINDINGS: Mild to moderate No intra-abdominal adhesions were noted from the fascia to the intra-abdominal cavity. Grossly normal uterus, tubes and ovaries. Clear amniotic fluid, frank breech, female infant, weight 2050gm, APGARs 9/9, intact placenta.   Results for Heidi, [name] (MRN [id number]) as of 09/30/2020 23:45  Ref. Range 09/30/2020 14:16 09/30/2020 14:19  pH cord blood (arterial) Latest Ref Range: 7.210 - 7.380  7.423 (H)   pCO2 cord blood (arterial) Latest Ref Range: 42.0 - 56.0 mmHg 27.8 (L)   Bicarbonate Latest Ref Range: 13.0 - 22.0 mmol/L 17.8 21.7  Ph Cord Blood (Venous) Latest Ref Range: 7.240 - 7.380   7.362  pCO2 Cord Blood (Venous) Latest Ref Range: 42.0 - 56.0   39.1 (L)    PROCEDURE IN DETAIL: The patient was taken to the operating room where anesthesia was administered and normal fetal heart tones were confirmed. She was then prepped and draped in the normal fashion in the dorsal supine position with a leftward tilt.  After a time out was performed, a pfannensteil  skin incision was made with the scalpel and carried through to the underlying layer of fascia. The fascia was then incised at the midline and this incision was  extended laterally with the mayo scissors. Attention was turned to the superior aspect of the fascial incision which was grasped with the kocher clamps x 2, tented up and the rectus muscles were dissected off with the scalpel. In a similar fashion the inferior aspect of the fascial incision was grasped with the kocher clamps, tented up and the rectus muscles dissected off with the mayo scissors. The rectus muscles were then separated in the midline and the peritoneum was entered bluntly. The bladder blade was inserted and the vesicouterine peritoneum was identified, tented up and entered with the metzenbaum scissors. This incision was extended laterally and the bladder flap was created digitally. The bladder blade was reinserted.  A low transverse hysterotomy was made with the scalpel until the endometrial cavity was breached and the amniotic sac ruptured with the Allis clamp, yielding scant, clear amniotic fluid. This incision was extended bluntly and the infant's head, shoulders and body were delivered atraumatically.The cord was clamped x 2 and cut, and the infant was handed to the awaiting pediatricians, after delayed cord clamping was done.  The placenta was then gradually expressed from the uterus and then the uterus was exteriorized and cleared of all clots and debris. The hysterotomy was repaired with a running suture of 1-0 monocryl. A second imbricating layer of 1-0 monocryl suture was then placed and a few figure of eight 1-0 vicryl sutures were placed were added to achieve excellent hemostasis.   The uterus and adnexa were then returned to the abdomen, and the hysterotomy and all operative sites were reinspected and excellent hemostasis  was noted after irrigation and suction of the abdomen with warm saline.  The peritoneum was closed with a running stitch of 3-0 Vicryl. The fascia was reapproximated with 0 Vicryl in a simple running fashion bilaterally. The subcutaneous layer was then  reapproximated with interrupted sutures of 2-0 plain gut, and the skin was then closed with 4-0 monocryl, in a subcuticular fashion.  The patient  tolerated the procedure well. Sponge, lap, needle, and instrument counts were correct x 2. The patient was transferred to the recovery room awake, alert and breathing independently in stable condition.  Cornelia Copa MD Attending Center for [facility])

## 2020-09-30 NOTE — Anesthesia Postprocedure Evaluation (Signed)
Anesthesia Post Note  Patient: Heidi [name]  Procedure(s) Performed: CESAREAN SECTION (N/A )     Patient location during evaluation: PACU Anesthesia Type: Spinal Level of consciousness: awake and alert Pain management: pain level controlled Vital Signs Assessment: post-procedure vital signs reviewed and stable Respiratory status: spontaneous breathing and respiratory function stable Cardiovascular status: blood pressure returned to baseline and stable Postop Assessment: spinal receding Anesthetic complications: no   No complications documented.  Last Vitals:  Vitals:   09/30/20 1600 09/30/20 1625  BP: 101/68 (!) 96/56  Pulse: 68 68  Resp: (!) 21 20  Temp: 36.7 C 37.1 C  SpO2: 97% 100%    Last Pain:  Vitals:   09/30/20 1625  TempSrc: Oral  PainSc:    Pain Goal: Patients Stated Pain Goal: 3 (09/29/20 0826)  LLE Motor Response: Purposeful movement (09/30/20 1600) LLE Sensation: Tingling (09/30/20 1600) RLE Motor Response: Purposeful movement (09/30/20 1600) RLE Sensation: Tingling (09/30/20 1600)        Kennieth Rad

## 2020-09-30 NOTE — Progress Notes (Signed)
OB Note I d/w her Dr. Parke Poisson re: delivery timing. Pt received BMZ#2 at 2230 last night and she's 35/0 with FGR and oligo. He stated delivery can be done anytime after 12 hours from BMZ#2. Pt has been NPO today. I d/w her and she is amenable for delivery. Will need c/s due to breech, FGR and oligo. Updated CBC ordered. OR aware. Continue continuous EFM until delivery fetus category I currently  Cornelia Copa MD Attending Center for [employer] (Faculty Practice) 09/30/2020 Time: [phone]

## 2020-09-30 NOTE — Lactation Note (Signed)
This note was copied from a baby's chart. Lactation Consultation Note  Patient Name: Heidi [name] [id number] Date: 09/30/2020 Reason for consult: Initial assessment;Late-preterm 34-36.6wks P4, 7 hour LPTI infant in NICU. Per mom, she BF her 2nd child for 4 months. Mom is not on the [facility] program and doesn't have DEBP at home, if income eligible LC suggest she apply for the [facility] program due to [facility] offering Loaner breast pumps. Per mom, she used DEBP and she understands to pump every 3 hours for 15 minutes on initial setting and hand express afterwards to do hands on pumping. Mom expressed 3 mls of colostrum from pumping. LC discussed hand expression and mom taught back expressing 2 mls in bullet. LC praised mom on her efforts of pumping and hand expression and having volume to give infant in NICU. Mom was excited to see that she has colostrum to give infant, she will have her husband to take bullets of colostrum to NICU before 4 hours and mom will follow NICU infant feeding guidelines for LPTI's.  Mom knows to call [facility] services if she has questions or concerns regarding breastfeeding. LC suggested mom attend the [place] Breastfeeding Support Group ( free) within the local community after discharged from the hospital. Mom made aware of O/P services, breastfeeding support groups, community resources, and our phone # for post-discharge questions.   Maternal Data Formula Feeding for Exclusion: No Has patient been taught Hand Expression?: Yes Does the patient have breastfeeding experience prior to this delivery?: Yes  Feeding Feeding Type: Donor Breast Milk  LATCH Score                   Interventions Interventions: Breast feeding basics reviewed;DEBP;Breast massage;Hand express  Lactation Tools Discussed/Used Tools: Pump Breast pump type: Double-Electric Breast Pump WIC Program: No Pump Review: Setup, frequency, and cleaning;Milk Storage Initiated by:: By RN Date  initiated:: 09/30/20   Consult Status Consult Status: Follow-up Date: 10/01/20 Follow-up type: In-patient    Danelle Earthly 09/30/2020, 9:35 PM

## 2020-10-01 DIAGNOSIS — Z98891 History of uterine scar from previous surgery: Secondary | ICD-10-CM

## 2020-10-01 DIAGNOSIS — Z8759 Personal history of other complications of pregnancy, childbirth and the puerperium: Secondary | ICD-10-CM

## 2020-10-01 LAB — CBC
HCT: 26.6 % — ABNORMAL LOW (ref 36.0–46.0)
Hemoglobin: 8.8 g/dL — ABNORMAL LOW (ref 12.0–15.0)
MCH: 30.1 pg (ref 26.0–34.0)
MCHC: 33.1 g/dL (ref 30.0–36.0)
MCV: 91.1 fL (ref 80.0–100.0)
Platelets: 198 10*3/uL (ref 150–400)
RBC: 2.92 MIL/uL — ABNORMAL LOW (ref 3.87–5.11)
RDW: 12.8 % (ref 11.5–15.5)
WBC: 14.6 10*3/uL — ABNORMAL HIGH (ref 4.0–10.5)
nRBC: 0 % (ref 0.0–0.2)

## 2020-10-01 MED ORDER — FERROUS SULFATE 325 (65 FE) MG PO TABS
325.0000 mg | ORAL_TABLET | Freq: Every day | ORAL | Status: DC
  Administered 2020-10-02 – 2020-10-03 (×2): 325 mg via ORAL
  Filled 2020-10-01 (×2): qty 1

## 2020-10-01 NOTE — Progress Notes (Addendum)
Daily Post Partum Note  10/01/2020 Heidi [name] is a 34 y.o. [id number]  POD#1 s/p rLTCS @ [redacted]w[redacted]d.  Pregnancy c/b FGR, oligo, breech 24hr/overnight events:  none  Subjective:  No flatus yet. Taking some PO. Pain controlled, +ambulation w/o issue. Lochia normal   Objective:    Current Vital Signs 24h Vital Sign Ranges  T 98 F (36.7 C) Temp  Avg: 98 F (36.7 C)  Min: 97.5 F (36.4 C)  Max: 98.7 F (37.1 C)  BP (!) 83/50 BP  Min: 82/39  Max: 113/68  HR 63 Pulse  Avg: 70  Min: 62  Max: 96  RR 16 Resp  Avg: 19.4  Min: 16  Max: 22  SaO2 100 %  (room air) SpO2  Avg: 98.4 %  Min: 95 %  Max: 100 %       24 Hour I/O Current Shift I/O  Time Ins Outs 12/04 0701 - 12/05 0700 In: 2720 [P.O.:120; I.V.:2600] Out: 1751 [Urine:1250] No intake/output data recorded.    General: NAD Abdomen: soft, rare BS. Firm fundus below the umbilicus. Incision dressing c/d/i Skin:  Warm and dry.  Cardiovascular: S1, S2 normal, no murmur, rub or gallop, regular rate and rhythm Respiratory:  Clear to auscultation bilateral. Normal respiratory effort Extremities: no c/c/e  Medications Current Facility-Administered Medications  Medication Dose Route Frequency Provider Last Rate Last Admin  . acetaminophen (TYLENOL) tablet 1,000 mg  1,000 mg Oral Q6H Marcene Duos, MD   1,000 mg at 10/01/20 0552  . acetaminophen (TYLENOL) tablet 1,000 mg  1,000 mg Oral Q8H Gita Kudo, MD   1,000 mg at 10/01/20 0933  . coconut oil  1 application Topical PRN Gita Kudo, MD      . witch hazel-glycerin (TUCKS) pad 1 application  1 application Topical PRN Marsala, Arlana Pouch, MD       And  . dibucaine (NUPERCAINAL) 1 % rectal ointment 1 application  1 application Rectal PRN Gita Kudo, MD      . diphenhydrAMINE (BENADRYL) injection 12.5 mg  12.5 mg Intravenous Q4H PRN Marcene Duos, MD       Or  . diphenhydrAMINE (BENADRYL) capsule 25 mg  25 mg Oral Q4H PRN Marcene Duos, MD      . diphenhydrAMINE  (BENADRYL) capsule 25 mg  25 mg Oral Q6H PRN Gita Kudo, MD      . enoxaparin (LOVENOX) injection 40 mg  40 mg Subcutaneous Q24H Gita Kudo, MD   40 mg at 10/01/20 0930  . ketorolac (TORADOL) 30 MG/ML injection 30 mg  30 mg Intravenous Q6H Gita Kudo, MD   30 mg at 10/01/20 0017   Followed by  . ibuprofen (ADVIL) tablet 800 mg  800 mg Oral Q6H Gita Kudo, MD      . ketorolac (TORADOL) 30 MG/ML injection 30 mg  30 mg Intravenous Q6H PRN Marcene Duos, MD   30 mg at 09/30/20 1542   Or  . ketorolac (TORADOL) 30 MG/ML injection 30 mg  30 mg Intramuscular Q6H PRN Marcene Duos, MD      . lactated ringers infusion   Intravenous Continuous Gita Kudo, MD 125 mL/hr at 09/30/20 1849 New Bag at 09/30/20 1849  . menthol-cetylpyridinium (CEPACOL) lozenge 3 mg  1 lozenge Oral Q2H PRN Gita Kudo, MD      . nalbuphine (NUBAIN) injection 5 mg  5 mg Intravenous Q4H PRN Marcene Duos, MD       Or  .  nalbuphine (NUBAIN) injection 5 mg  5 mg Subcutaneous Q4H PRN Marcene Duos, MD      . nalbuphine (NUBAIN) injection 5 mg  5 mg Intravenous Once PRN Marcene Duos, MD       Or  . nalbuphine (NUBAIN) injection 5 mg  5 mg Subcutaneous Once PRN Marcene Duos, MD      . naloxone [facility]) injection 0.4 mg  0.4 mg Intravenous PRN Marcene Duos, MD       And  . sodium chloride flush (NS) 0.9 % injection 3 mL  3 mL Intravenous PRN Marcene Duos, MD      . naloxone HCl [facility]) 2 mg in dextrose 5 % 250 mL infusion  1-4 mcg/kg/hr Intravenous Continuous PRN Marcene Duos, MD      . ondansetron [facility]) injection 4 mg  4 mg Intravenous Q8H PRN Marcene Duos, MD      . oxyCODONE (Oxy IR/ROXICODONE) immediate release tablet 5-10 mg  5-10 mg Oral Q4H PRN Gita Kudo, MD      . prenatal multivitamin tablet 1 tablet  1 tablet Oral Q1200 Gita Kudo, MD      . scopolamine (TRANSDERM-SCOP) 1 MG/3DAYS 1.5 mg  1 patch Transdermal Once  Marcene Duos, MD   1.5 mg at 09/30/20 1541  . senna-docusate (Senokot-S) tablet 2 tablet  2 tablet Oral Q24H Gita Kudo, MD   2 tablet at 10/01/20 0007  . simethicone (MYLICON) chewable tablet 80 mg  80 mg Oral TID PC Gita Kudo, MD   80 mg at 10/01/20 0931  . simethicone (MYLICON) chewable tablet 80 mg  80 mg Oral Q24H Gita Kudo, MD   80 mg at 10/01/20 0007  . simethicone (MYLICON) chewable tablet 80 mg  80 mg Oral PRN Gita Kudo, MD        Labs:  Recent Labs  Lab 09/28/20 2051 09/30/20 0945 10/01/20 0153  WBC 10.2 10.2 14.6*  HGB 10.4* 9.8* 8.8*  HCT 30.5* 28.2* 26.6*  PLT 200 152 198    Assessment & Plan:  Pt doing well *Postpartum/postop: A POS. Girl. Br and formula. Undecided on contraception. No s/s of anemia. qday iron ordered *Dispo: pod#2 or 3  Cornelia Copa. MD Attending Center for [facility])

## 2020-10-01 NOTE — Lactation Note (Signed)
This note was copied from a baby's chart. Lactation Consultation Note  Patient Name: Heidi [name] [id number] Date: 10/01/2020 Reason for consult: Follow-up assessment;NICU baby;Infant < 6lbs;Late-preterm 34-36.6wks  LC in to visit with P4 Mom of LPTI with IUGR in the NICU.  Baby 23 hrs old and being gavage fed DBM.  Mom resting in bed and states she has been pumping and hand expressing colostrum.  Colostrum container in frig and Mom will take this to NICU for baby.  Reassured Mom that volume would increase after a few days of consistent pumping.  Encouraged Mom to ask for STS when she is visiting baby in the NICU, and encouraged to pump both breasts after.  Mom aware of DEBP in baby's room, that she can utilize.   Provided a separate bin for drying pump parts as parts were drying on paper towel by the sink.  Mom encouraged to pump both breasts at same time to elevate her milk producing hormones higher.  Mom had been pumping one breast at a time.     Interventions Interventions: Breast feeding basics reviewed;Skin to skin;Breast massage;Hand express;DEBP;Hand pump  Lactation Tools Discussed/Used Tools: Pump Breast pump type: Double-Electric Breast Pump WIC Program: No   Consult Status Consult Status: Follow-up Date: 10/02/20 Follow-up type: In-patient    Judee Clara 10/01/2020, 1:28 PM

## 2020-10-02 ENCOUNTER — Encounter (HOSPITAL_COMMUNITY): Payer: Self-pay | Admitting: Obstetrics and Gynecology

## 2020-10-02 NOTE — Progress Notes (Signed)
Subjective: Postpartum Day 2: Repeat Cesarean Delivery d/t FGR and breech presentation  Pt has no complaints this morning. Ambulating, voiding, tolerating diet, and good oral pain control.  Objective: Vital signs in last 24 hours: Temp:  [97.6 F (36.4 C)-98 F (36.7 C)] 97.6 F (36.4 C) (12/06 0139) Pulse Rate:  [63-104] 104 (12/06 0139) Resp:  [16-19] 16 (12/06 0139) BP: (83-108)/(50-70) 95/70 (12/06 0139) SpO2:  [100 %] 100 % (12/06 0139)  Physical Exam:  General: alert Lochia: appropriate Uterine Fundus: firm Incision: healing well DVT Evaluation: No evidence of DVT seen on physical exam.  Recent Labs    09/30/20 0945 10/01/20 0153  HGB 9.8* 8.8*  HCT 28.2* 26.6*    Assessment/Plan: Status post Cesarean section. Doing well postoperatively.  Continue current care. Anticipate discharge tomorrow  Hermina Staggers 10/02/2020, 7:20 AM

## 2020-10-02 NOTE — Lactation Note (Signed)
This note was copied from a baby's chart. Lactation Consultation Note  Patient Name: Heidi [name] [id number] Date: 10/02/2020 Reason for consult: Follow-up assessment  LC Follow Up Visit:  Attempted to visit with mother, however, she is not in her room at this time.  Will go to NICU for a visit.   Maternal Data    Feeding Feeding Type: Donor Breast Milk  LATCH Score                   Interventions    Lactation Tools Discussed/Used     Consult Status Consult Status: Follow-up Date: 10/03/20 Follow-up type: In-patient    Roseanne Juenger R Crystall Donaldson 10/02/2020, 5:15 PM

## 2020-10-02 NOTE — Progress Notes (Signed)
Patient screened out for psychosocial assessment since none of the following apply: °Psychosocial stressors documented in mother or baby's chart °Gestation less than 32 weeks °Code at delivery  °Infant with anomalies °Please contact the Clinical Social Worker if specific needs arise, by MOB's request, or if MOB scores greater than 9/yes to question 10 on Edinburgh Postpartum Depression Screen. ° °Earsie Humm Boyd-Gilyard, MSW, LCSW °Clinical Social Work °[phone] °  °

## 2020-10-02 NOTE — Lactation Note (Signed)
This note was copied from a baby's chart. Lactation Consultation Note  Patient Name: Heidi [name] [id number] Date: 10/02/2020 Reason for consult: Follow-up assessment  P4 mother whose infant is now 38 hours old.  This is a LPTI born at 35+0 weeks with a CGA of 35+2 weeks weighing < 5 lbs and in the NICU.  Parents were at bedside; mother pumping with the DEBP when I arrived.  She was only pumping one breast at a time.  When asked if she would like to double pump to decrease her pumping time, mother declined.  She desires to pump one breast at a time.  Father stated, "It gives her something to do."  At the last pumping session mother obtained half a bottle of EBM and is currently obtaining close to that same amount now.  She continues to do hand expression and breast compressions during pumping.  Praised mother for her efforts.    Parents will call for any questions/concerns.  Baby sleeping comfortably in the isolette.    Maternal Data    Feeding Feeding Type: Donor Breast Milk  LATCH Score                   Interventions    Lactation Tools Discussed/Used     Consult Status Consult Status: Follow-up Date: 10/03/20 Follow-up type: In-patient    Heidi [name] R Dany Walther 10/02/2020, 5:30 PM

## 2020-10-03 LAB — SURGICAL PATHOLOGY

## 2020-10-03 MED ORDER — OXYCODONE HCL 5 MG PO TABS: 5.0000 mg | ORAL_TABLET | ORAL | 0 refills | Status: DC | PRN

## 2020-10-03 MED ORDER — FERROUS SULFATE 325 (65 FE) MG PO TABS
325.0000 mg | ORAL_TABLET | Freq: Every day | ORAL | 1 refills | Status: DC
Start: 2020-10-04 — End: 2024-02-26

## 2020-10-03 MED ORDER — IBUPROFEN 800 MG PO TABS
800.0000 mg | ORAL_TABLET | Freq: Four times a day (QID) | ORAL | 1 refills | Status: DC
Start: 2020-10-03 — End: 2024-02-26

## 2020-10-03 NOTE — Discharge Instructions (Signed)
Cesarean Delivery, Care After This sheet gives you information about how to care for yourself after your procedure. Your health care provider may also give you more specific instructions. If you have problems or questions, contact your health care provider. What can I expect after the procedure? After the procedure, it is common to have:  A small amount of blood or clear fluid coming from the incision.  Some redness, swelling, and pain in your incision area.  Some abdominal pain and soreness.  Vaginal bleeding (lochia). Even though you did not have a vaginal delivery, you will still have vaginal bleeding and discharge.  Pelvic cramps.  Fatigue. You may have pain, swelling, and discomfort in the tissue between your vagina and your anus (perineum) if:  Your C-section was unplanned, and you were allowed to labor and push.  An incision was made in the area (episiotomy) or the tissue tore during attempted vaginal delivery. Follow these instructions at home: Incision care   Follow instructions from your health care provider about how to take care of your incision. Make sure you: ? Wash your hands with soap and water before you change your bandage (dressing). If soap and water are not available, use hand sanitizer. ? If you have a dressing, change it or remove it as told by your health care provider. ? Leave stitches (sutures), skin staples, skin glue, or adhesive strips in place. These skin closures may need to stay in place for 2 weeks or longer. If adhesive strip edges start to loosen and curl up, you may trim the loose edges. Do not remove adhesive strips completely unless your health care provider tells you to do that.  Check your incision area every day for signs of infection. Check for: ? More redness, swelling, or pain. ? More fluid or blood. ? Warmth. ? Pus or a bad smell.  Do not take baths, swim, or use a hot tub until your health care provider says it's okay. Ask your health  care provider if you can take showers.  When you cough or sneeze, hug a pillow. This helps with pain and decreases the chance of your incision opening up (dehiscing). Do this until your incision heals. Medicines  Take over-the-counter and prescription medicines only as told by your health care provider.  If you were prescribed an antibiotic medicine, take it as told by your health care provider. Do not stop taking the antibiotic even if you start to feel better.  Do not drive or use heavy machinery while taking prescription pain medicine. Lifestyle  Do not drink alcohol. This is especially important if you are breastfeeding or taking pain medicine.  Do not use any products that contain nicotine or tobacco, such as cigarettes, e-cigarettes, and chewing tobacco. If you need help quitting, ask your health care provider. Eating and drinking  Drink at least 8 eight-ounce glasses of water every day unless told not to by your health care provider. If you breastfeed, you may need to drink even more water.  Eat high-fiber foods every day. These foods may help prevent or relieve constipation. High-fiber foods include: ? Whole grain cereals and breads. ? Brown rice. ? Beans. ? Fresh fruits and vegetables. Activity   If possible, have someone help you care for your baby and help with household activities for at least a few days after you leave the hospital.  Return to your normal activities as told by your health care provider. Ask your health care provider what activities are safe for   you.  Rest as much as possible. Try to rest or take a nap while your baby is sleeping.  Do not lift anything that is heavier than 10 lbs (4.5 kg), or the limit that you were told, until your health care provider says that it is safe.  Talk with your health care provider about when you can engage in sexual activity. This may depend on your: ? Risk of infection. ? How fast you heal. ? Comfort and desire to  engage in sexual activity. General instructions  Do not use tampons or douches until your health care provider approves.  Wear loose, comfortable clothing and a supportive and well-fitting bra.  Keep your perineum clean and dry. Wipe from front to back when you use the toilet.  If you pass a blood clot, save it and call your health care provider to discuss. Do not flush blood clots down the toilet before you get instructions from your health care provider.  Keep all follow-up visits for you and your baby as told by your health care provider. This is important. Contact a health care provider if:  You have: ? A fever. ? Bad-smelling vaginal discharge. ? Pus or a bad smell coming from your incision. ? Difficulty or pain when urinating. ? A sudden increase or decrease in the frequency of your bowel movements. ? More redness, swelling, or pain around your incision. ? More fluid or blood coming from your incision. ? A rash. ? Nausea. ? Little or no interest in activities you used to enjoy. ? Questions about caring for yourself or your baby.  Your incision feels warm to the touch.  Your breasts turn red or become painful or hard.  You feel unusually sad or worried.  You vomit.  You pass a blood clot from your vagina.  You urinate more than usual.  You are dizzy or light-headed. Get help right away if:  You have: ? Pain that does not go away or get better with medicine. ? Chest pain. ? Difficulty breathing. ? Blurred vision or spots in your vision. ? Thoughts about hurting yourself or your baby. ? New pain in your abdomen or in one of your legs. ? A severe headache.  You faint.  You bleed from your vagina so much that you fill more than one sanitary pad in one hour. Bleeding should not be heavier than your heaviest period. Summary  After the procedure, it is common to have pain at your incision site, abdominal cramping, and slight bleeding from your vagina.  Check  your incision area every day for signs of infection.  Tell your health care provider about any unusual symptoms.  Keep all follow-up visits for you and your baby as told by your health care provider. This information is not intended to replace advice given to you by your health care provider. Make sure you discuss any questions you have with your health care provider. Document Revised: 04/22/2018 Document Reviewed: 04/22/2018 Elsevier Patient Education  2020 [employer]  

## 2020-10-03 NOTE — Progress Notes (Signed)
Discharge instructions reviewed with patient. Patient verbalized understanding of post-op care, medications, and follow up care.   Pt walked off unit to NICU.

## 2020-10-05 ENCOUNTER — Ambulatory Visit: Payer: Medicaid Other

## 2020-10-05 ENCOUNTER — Ambulatory Visit: Payer: Self-pay

## 2020-10-05 NOTE — Lactation Note (Signed)
This note was copied from a baby's chart. Patient Name: Heidi [name] [id number] Date: 10/05/2020 Reason for consult: Follow-up assessment  Lactation Consultation Note  LC to room for f/u visit. Mom holding sleeping baby. Mom's milk is in and denies engorgement. She continues to pump q2-3 hours with 2-4 oz yield each time. Mother was provided with the opportunity to ask questions. All concerns were addressed.  Will plan follow up visit.   Consult Status Consult Status: Follow-up Follow-up type: In-patient   Elder Negus, MA IBCLC 10/05/2020, 6:03 PM

## 2020-10-06 ENCOUNTER — Encounter: Payer: Medicaid Other | Admitting: Obstetrics and Gynecology

## 2020-10-09 ENCOUNTER — Ambulatory Visit (INDEPENDENT_AMBULATORY_CARE_PROVIDER_SITE_OTHER): Payer: Medicaid Other

## 2020-10-09 ENCOUNTER — Other Ambulatory Visit: Payer: Self-pay

## 2020-10-09 VITALS — BP 133/93 | HR 60 | Wt 158.5 lb

## 2020-10-09 DIAGNOSIS — Z5189 Encounter for other specified aftercare: Secondary | ICD-10-CM

## 2020-10-09 NOTE — Progress Notes (Signed)
Pt here today for incision check following second c-section on 09/30/20. Soiled honeycomb dressing and steristrips removed. Incision is clean and dry with small open pink, healing area to middle of incision. 6 steristrips applied to cover open area. Pt denies any pain to the area. Encouraged good wound care and reviewed s/s of infection. BP today is elevated. Pt reports no history of elevated BP prior, including [facility]. Reviewed flowsheets, no abnormal BP during admission. Reviewed with Myriam Jacobson, MD who recommends patient return for BP check tomorrow. Will consider starting BP meds at that time.   Fleet Contras RN 10/09/20

## 2020-10-10 ENCOUNTER — Ambulatory Visit: Payer: Medicaid Other

## 2020-10-10 NOTE — Progress Notes (Signed)
I agree with the care plan as documented in the RN note.    Fed Ceci, MD OB Family Medicine Fellow, [facility], [facility]  

## 2020-10-11 ENCOUNTER — Ambulatory Visit: Payer: Self-pay

## 2020-10-11 NOTE — Lactation Note (Signed)
This note was copied from a baby's chart. Lactation Consultation Note LC to room for f/u visit. Mom is no longer pumping or breastfeeding. LC services are complete. No charge for this brief encounter.  Elder Negus, MA IBCLC 10/11/2020, 3:27 PM

## 2020-10-12 ENCOUNTER — Ambulatory Visit: Payer: Medicaid Other

## 2021-05-21 IMAGING — US US MFM FETAL BPP W/O NON-STRESS
1 series · 15 of 28 positions shown · non-contrast
Comparison: none

[Series 1: us mfm fetal bpp w/o non-stress · 34 acquisitions, 15 frames shown]
[im 1/34]
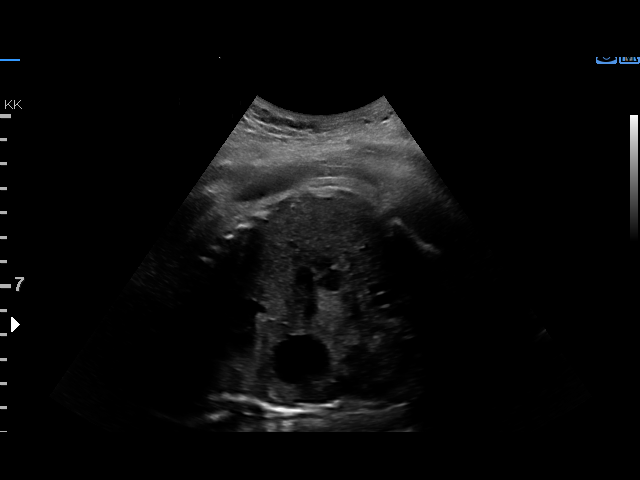
[im 3/34]
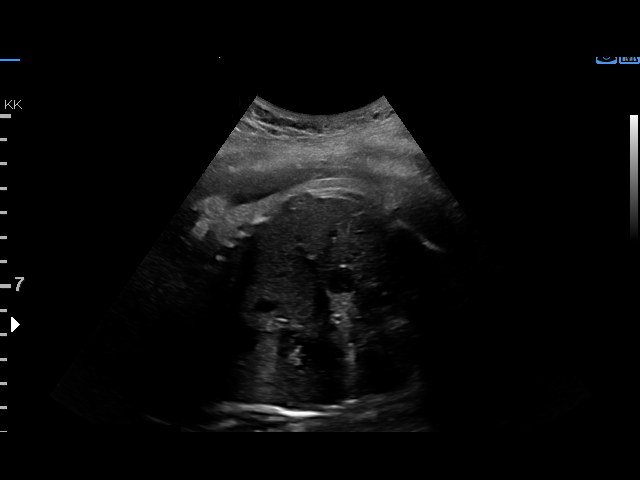
[im 5/34]
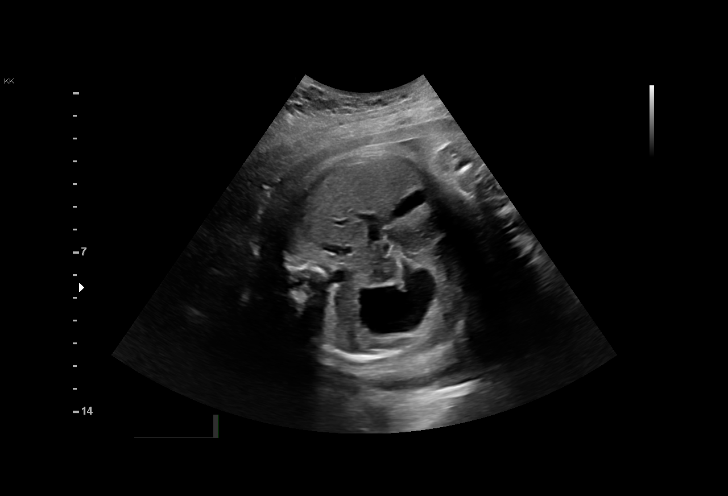
[im 8/34]
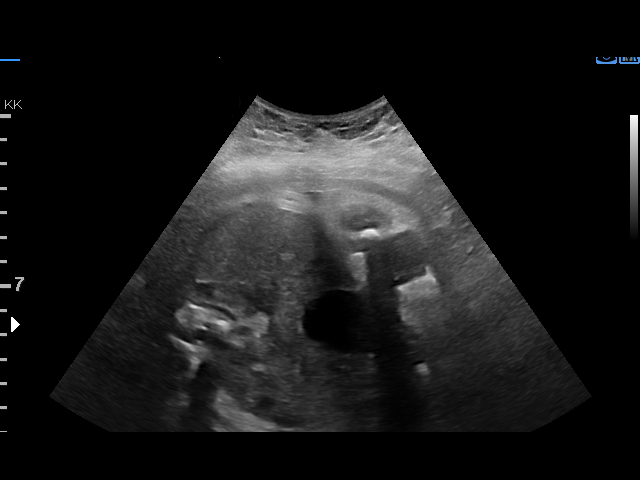
[im 10/34]
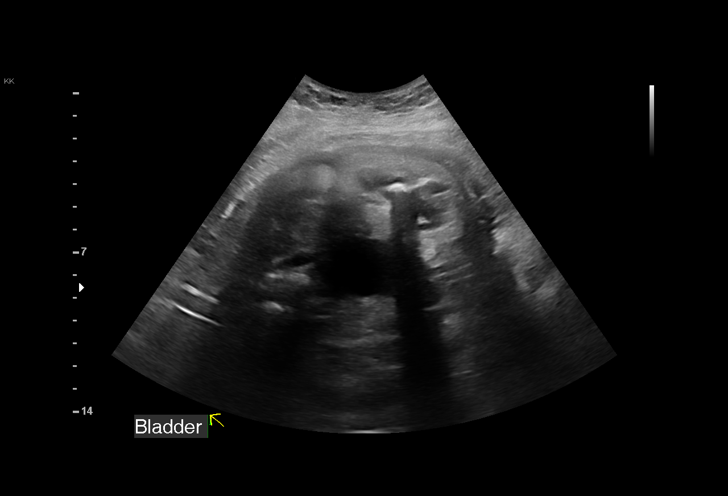
[im 13/34]
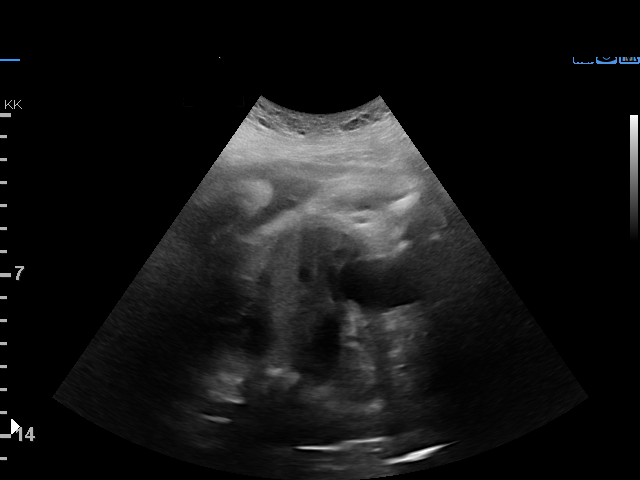
[im 15/34]
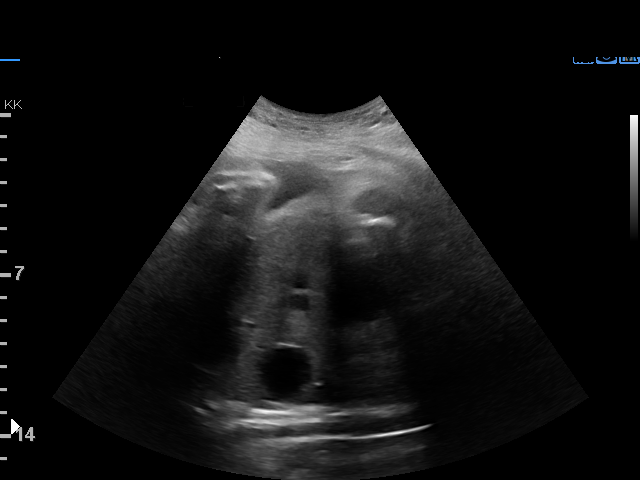
[im 18/34]
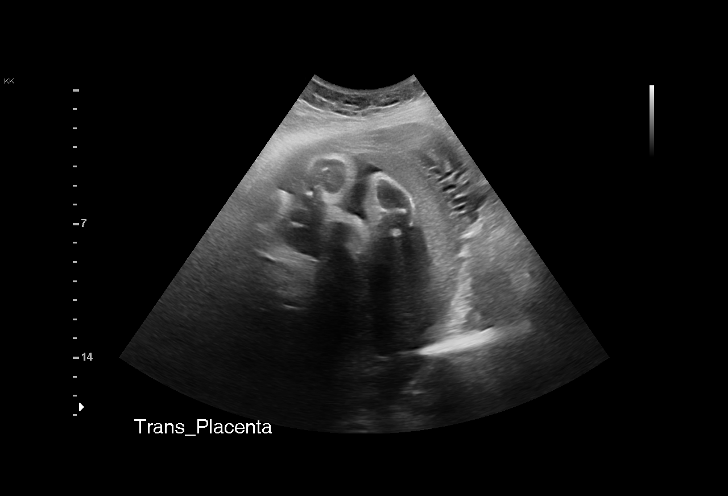
[im 19/34]
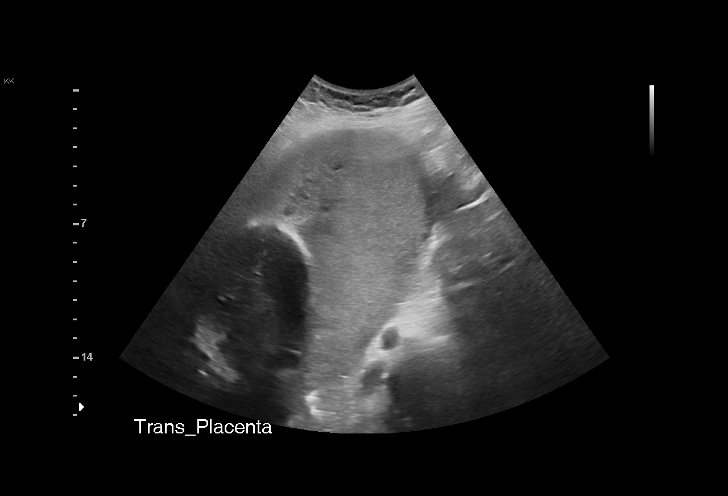
[im 21/34]
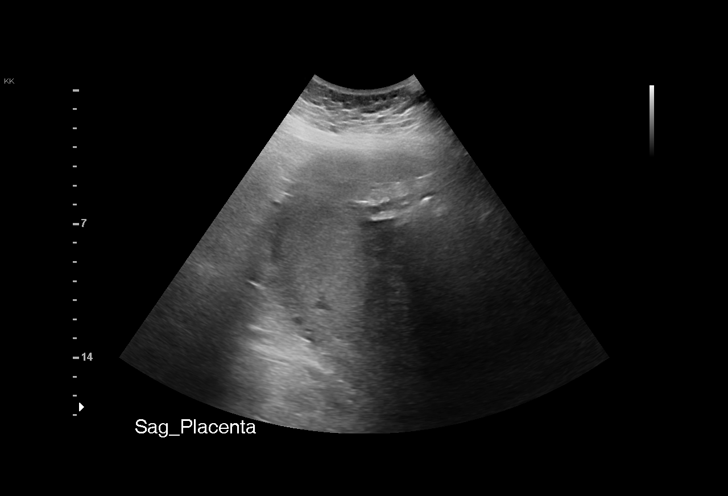
[im 24/34]
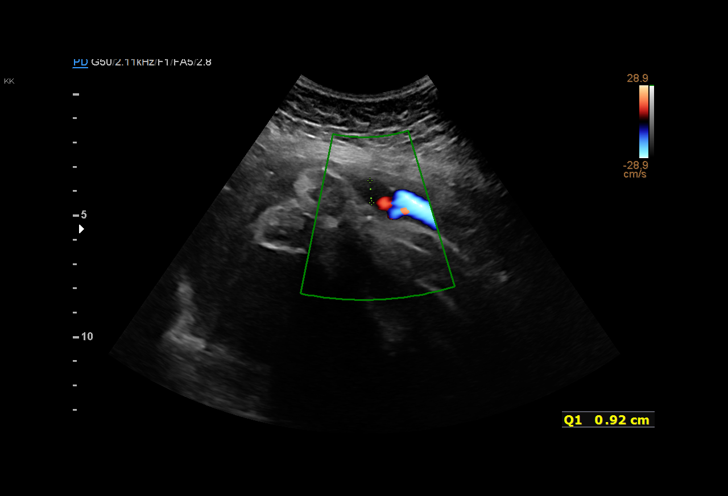
[im 26/34]
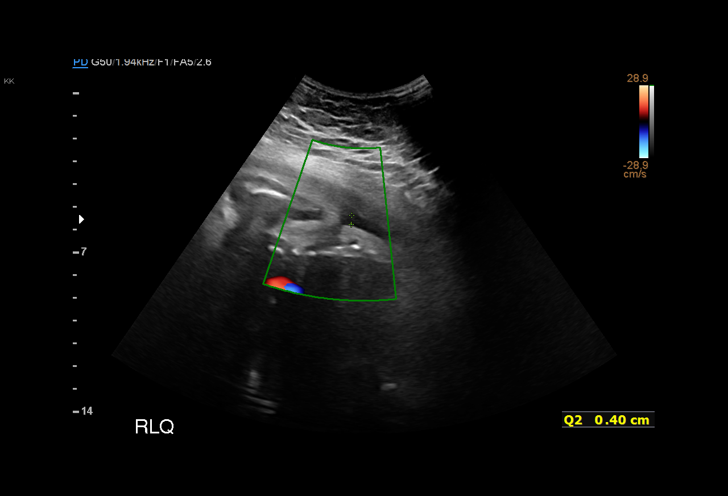
[im 29/34]
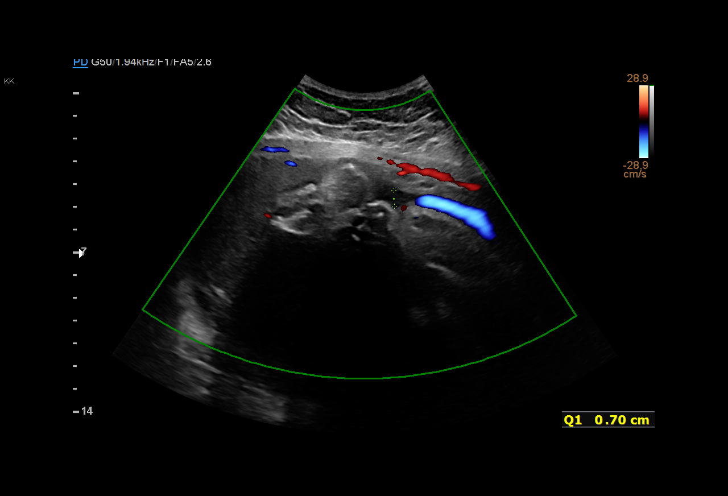
[im 31/34]
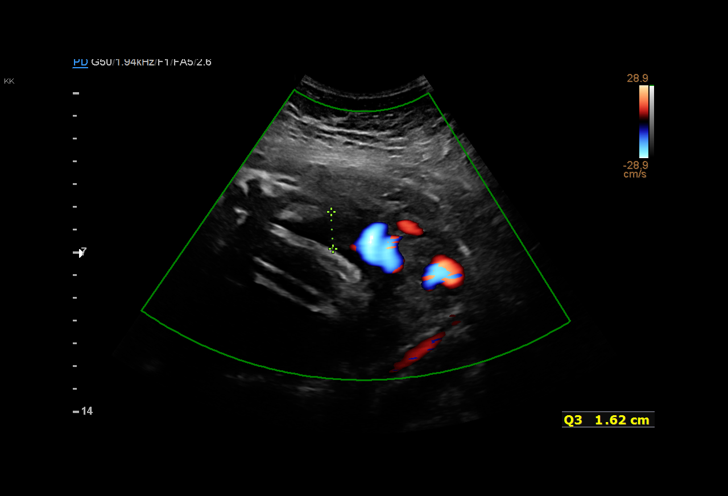
[im 34/34]
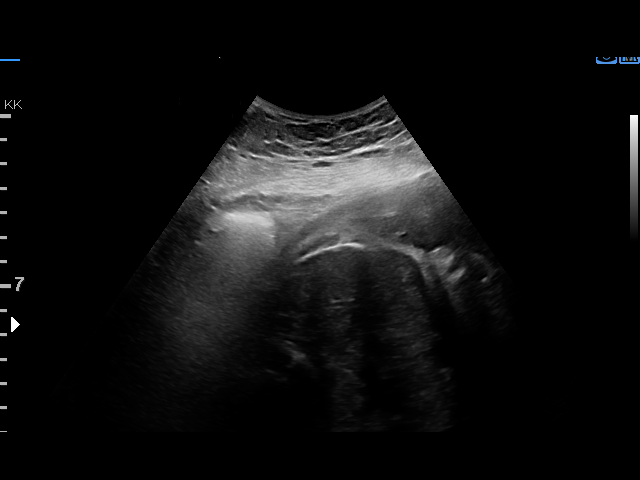

[15 of 28 positions shown; findings below may reference images not displayed]

[REDACTED]-
                   Faculty Physician

Indications

 Oligohydramnios / Decreased amniotic fluid
 volume
 Previous cesarean delivery, antepartum
 Small for gestational age fetus affecting
 management of mother
 34 weeks gestation of pregnancy
Fetal Evaluation

 Num Of Fetuses:         1
 Fetal Heart Rate(bpm):  126
 Cardiac Activity:       Observed
 Presentation:           Frank breech
 Placenta:               Posterior
 P. Cord Insertion:      Previously Visualized

 Amniotic Fluid
 AFI FV:      Oligohydramnios

 AFI Sum(cm)     %Tile       Largest Pocket(cm)
 2.58            < 3

 RUQ(cm)                     LUQ(cm)        LLQ(cm)

Biophysical Evaluation

 Amniotic F.V:   Oligohydramnios            F. Tone:        Observed
 F. Movement:    Observed                   Score:          [DATE]
 F. Breathing:   Observed
OB History

 Gravidity:    1
Gestational Age

 LMP:           34w 6d        Date:  01/29/20                 EDD:   11/04/20
 Best:          34w 6d     Det. By:  LMP  (01/29/20)          EDD:   11/04/20
Anatomy

 Stomach:               Visualized             Bladder:                Visualized
Comments

 This patient has been hospitalized due to an IUGR fetus with
 oligohydramnios.  She has ruled out for rupture of
 membranes.  Due to oligohydramnios, she is receiving a
 complete course of antenatal corticosteroids.

 A biophysical profile performed today was [DATE].  She
 received a -2 for as a two cm pocket of amniotic fluid was not
 present today.

 Due to oligohydramnios at her current gestational age (she
 will be 35 weeks tomorrow), I would recommend delivery
 once she completes her steroid course.

 The patient understands that she will most likely require a
 cesarean delivery as the fetus remains in the breech
 presentation.  The patient is agreeable to this plan.

## 2022-04-10 DIAGNOSIS — Z0389 Encounter for observation for other suspected diseases and conditions ruled out: Secondary | ICD-10-CM | POA: Diagnosis not present

## 2022-06-30 ENCOUNTER — Ambulatory Visit (HOSPITAL_COMMUNITY)
Admission: EM | Admit: 2022-06-30 | Discharge: 2022-06-30 | Disposition: A | Discharge status: Home / Self Care | Payer: Medicaid Other | Attending: Emergency Medicine | Admitting: Emergency Medicine

## 2022-06-30 ENCOUNTER — Other Ambulatory Visit: Payer: Self-pay

## 2022-06-30 ENCOUNTER — Encounter (HOSPITAL_COMMUNITY): Payer: Self-pay | Admitting: *Deleted

## 2022-06-30 DIAGNOSIS — M25572 Pain in left ankle and joints of left foot: Secondary | ICD-10-CM

## 2022-06-30 MED ORDER — CYCLOBENZAPRINE HCL 10 MG PO TABS: 10.0000 mg | ORAL_TABLET | Freq: Every day | ORAL | 0 refills | Status: DC

## 2022-06-30 MED ORDER — PREDNISONE 20 MG PO TABS: 40.0000 mg | ORAL_TABLET | Freq: Every day | ORAL | 0 refills | Status: DC

## 2022-06-30 NOTE — Discharge Instructions (Addendum)
   You may apply heat or ice, whichever makes you feel better, to affected area in 15 minute intervals  You may continue activity as tolerated, there is no injury therefore, it is important that you continue to move around so you do not loose strength to the area  For the first 2-3 days you may wrap ankle with ace wrap for additional support while completing activities, once wrapped if you begin to experience numbness or tingling it is too tight, remove and redo, you should be able to easily fit one finger under wrap   If symptoms persist past 2 weeks, you may follow up at urgent care or with orthopedic specialist for evaluation, an orthopedic doctor specializes in the bone, they may provide  management such as but not limited to imaging, long term medications and physical therapy

## 2022-06-30 NOTE — ED Provider Notes (Signed)
[facility]    CSN: [id number] Arrival date & time: 06/30/22  1340      History   Chief Complaint Chief Complaint  Patient presents with   Ankle Pain    HPI Heidi [name] is a 36 y.o. female.   Patient presents with left lateral ankle pain and swelling beginning 1 month ago.  Denies injury, trauma or precipitating event.  Pain is worse when lying down at night and can be elicited with bearing weight.  Range of motion is intact.  Endorses swelling to the area 2 weeks ago but unsure of resolved.  Has been taking Advil which has been somewhat helpful.  Denies numbness or tingling.   Past Medical History:  Diagnosis Date   Medical history non-contributory     Patient Active Problem List   Diagnosis Date Noted   IUGR (intrauterine growth restriction) affecting care of mother 09/30/2020   Oligohydramnios antepartum 09/30/2020   Cesarean delivery delivered 09/30/2020   History of cesarean delivery affecting pregnancy 09/29/2020   Oligohydramnios 09/28/2020   Pregnancy affected by fetal growth restriction 09/14/2020    Past Surgical History:  Procedure Laterality Date   CESAREAN SECTION     CESAREAN SECTION N/A 09/30/2020   Procedure: CESAREAN SECTION;  Surgeon: Hermina Staggers, MD;  Location: [facility];  Service: Obstetrics;  Laterality: N/A;    OB History     Gravida  4   Para  4   Term  3   Preterm  1   AB      Living  4      SAB      IAB      Ectopic      Multiple  0   Live Births  1            Home Medications    Prior to Admission medications   Medication Sig Start Date End Date Taking? Authorizing Provider  ferrous sulfate 325 (65 FE) MG tablet Take 1 tablet (325 mg total) by mouth daily with breakfast. 10/04/20   Constant, Peggy, MD  ibuprofen (ADVIL) 800 MG tablet Take 1 tablet (800 mg total) by mouth every 6 (six) hours. 10/03/20   Constant, Peggy, MD  oxyCODONE (OXY IR/ROXICODONE) 5 MG immediate release tablet Take 1 tablet  (5 mg total) by mouth every 4 (four) hours as needed for moderate pain. 10/03/20   Constant, Peggy, MD  Prenatal Vit-Fe Fumarate-FA (PRENATAL VITAMIN PO) Take 1 tablet by mouth daily.     [provider]    Family History History reviewed. No pertinent family history.  Social History Social History   Tobacco Use   Smoking status: Never   Smokeless tobacco: Never  Vaping Use   Vaping Use: Never used  Substance Use Topics   Alcohol use: Never   Drug use: Never     Allergies   Patient has no known allergies.   Review of Systems Review of Systems  Constitutional: Negative.   Respiratory: Negative.    Cardiovascular: Negative.   Musculoskeletal:  Positive for gait problem and joint swelling. Negative for arthralgias, back pain, myalgias, neck pain and neck stiffness.  Skin: Negative.      Physical Exam Triage Vital Signs ED Triage Vitals  Enc Vitals Group     BP 06/30/22 1426 98/67     Pulse Rate 06/30/22 1426 76     Resp 06/30/22 1426 18     Temp 06/30/22 1426 98 F (36.7 C)  Temp src --      SpO2 06/30/22 1426 98 %     Weight --      Height --      Head Circumference --      Peak Flow --      Pain Score 06/30/22 1424 8     Pain Loc --      Pain Edu? --      Excl. in GC? --    No data found.  Updated Vital Signs BP 98/67   Pulse 76   Temp 98 F (36.7 C)   Resp 18   LMP  (LMP Unknown) Comment: NO menses for 2 yesrs with IUD  SpO2 98%   Visual Acuity Right Eye Distance:   Left Eye Distance:   Bilateral Distance:    Right Eye Near:   Left Eye Near:    Bilateral Near:     Physical Exam Constitutional:      Appearance: Normal appearance.  HENT:     Head: Normocephalic.  Eyes:     Extraocular Movements: Extraocular movements intact.  Pulmonary:     Effort: Pulmonary effort is normal.  Musculoskeletal:     Comments: Tenderness and mild to moderate swelling present over the lateral malleolus of the left ankle, no ecchymosis or  deformity noted, range of motion intact, 2+ dorsalis pedis pulse, sensation intact  Neurological:     Mental Status: She is alert and oriented to person, place, and time. Mental status is at baseline.  Psychiatric:        Mood and Affect: Mood normal.        Behavior: Behavior normal.      UC Treatments / Results  Labs (all labs ordered are listed, but only abnormal results are displayed) Labs Reviewed - No data to display  EKG   Radiology No results found.  Procedures Procedures (including critical care time)  Medications Ordered in UC Medications - No data to display  Initial Impression / Assessment and Plan / UC Course  I have reviewed the triage vital signs and the nursing notes.  Pertinent labs & imaging results that were available during my care of the patient were reviewed by me and considered in my medical decision making (see chart for details).  Acute left ankle pain  As there were no injury will defer imaging today, discussed with patient, prescribed prednisone and Flexeril for outpatient management, ankle brace applied for stability and support by nursing staff, to be worn when completing activities, recommend ice and heat over the affected area 10 to 15-minute intervals with activity as tolerated, if symptoms persist an additional 7 days after consistent use of medicine and supportive care recommended follow-up with orthopedics, given walking referral, work note given Final Clinical Impressions(s) / UC Diagnoses   Final diagnoses:  None   Discharge Instructions   None    ED Prescriptions   None    PDMP not reviewed this encounter.   Valinda Hoar, NP 06/30/22 1455

## 2022-06-30 NOTE — ED Triage Notes (Signed)
Pt reports ankle pain for one month. Pt points to Lt lateral ankle. Pt reports pain when walking.

## 2022-08-21 DIAGNOSIS — Z30432 Encounter for removal of intrauterine contraceptive device: Secondary | ICD-10-CM | POA: Diagnosis not present

## 2022-08-21 DIAGNOSIS — Z3009 Encounter for other general counseling and advice on contraception: Secondary | ICD-10-CM | POA: Diagnosis not present

## 2022-10-14 IMAGING — US US MFM OB DETAIL+14 WK
1 series · 12 of 28 positions shown · non-contrast
Comparison: none

[Series 1: us mfm ob detail+14 wk · 105 acquisitions, 12 frames shown]
[im 4/105]
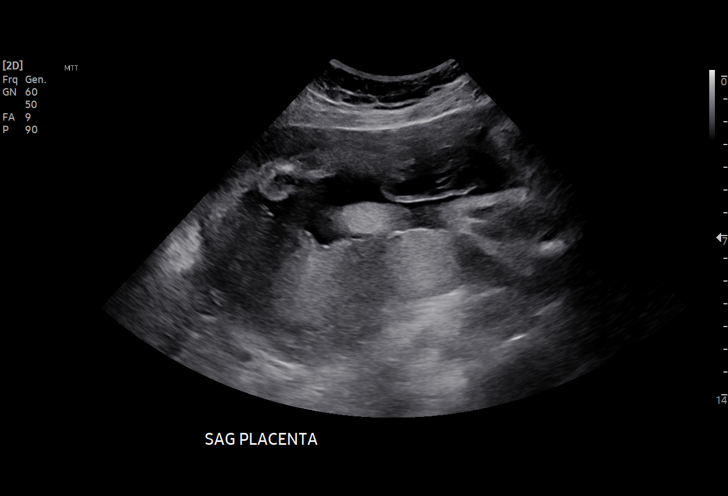
[im 12/105]
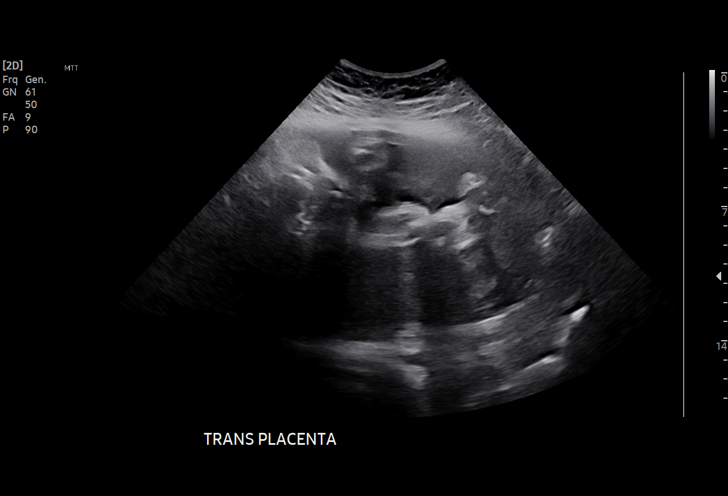
[im 20/105]
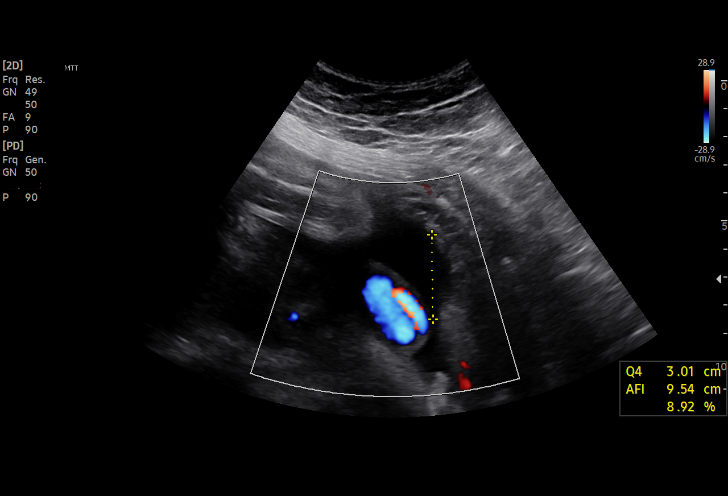
[im 31/105]
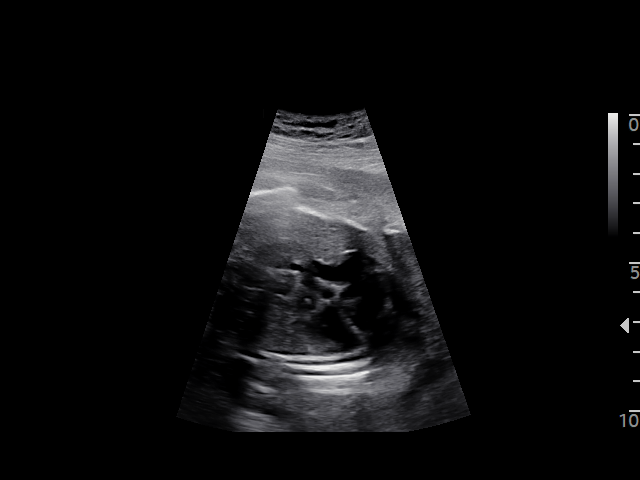
[im 39/105]
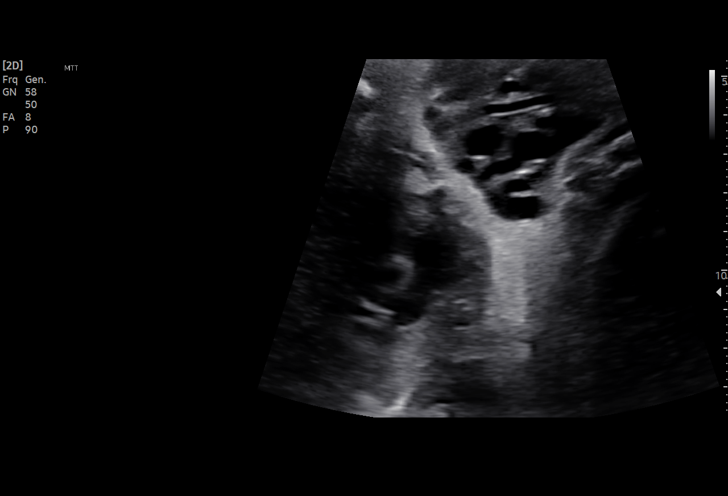
[im 47/105]
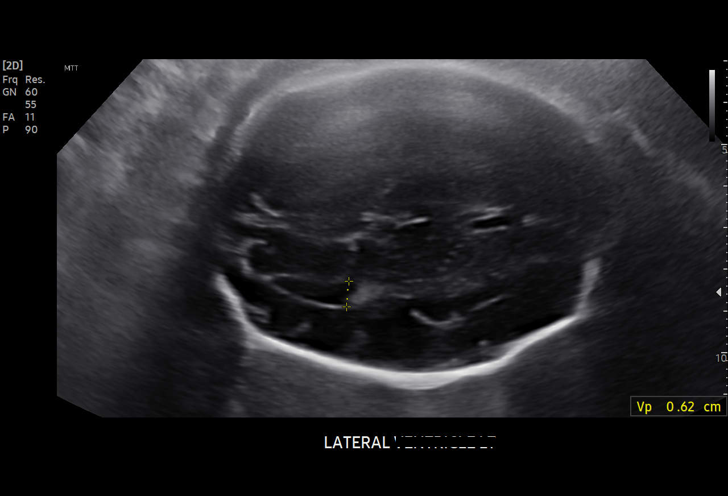
[im 58/105]
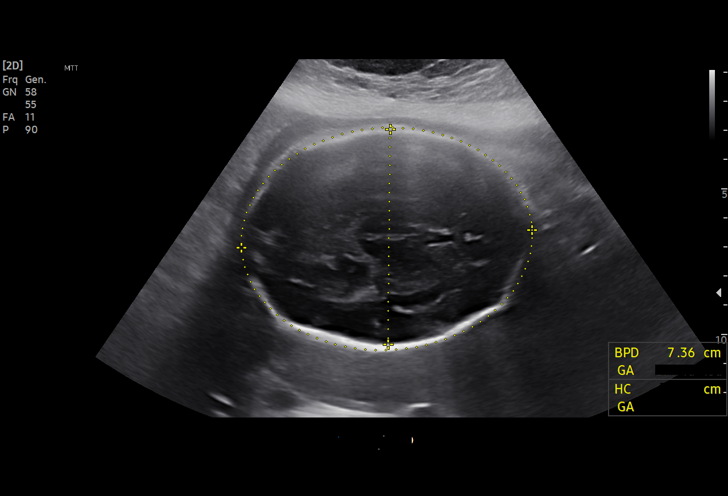
[im 66/105]
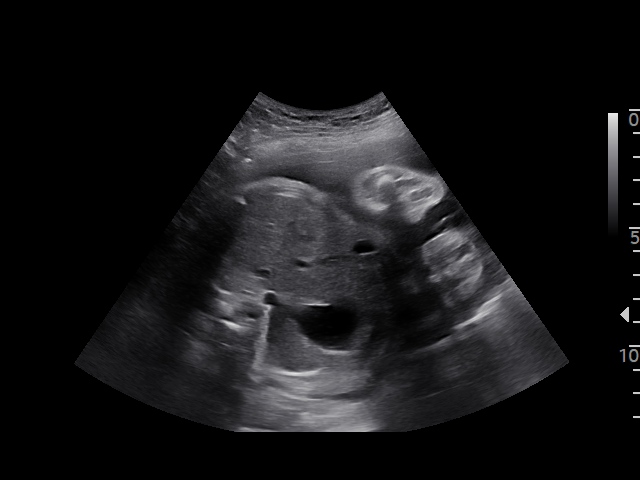
[im 74/105]
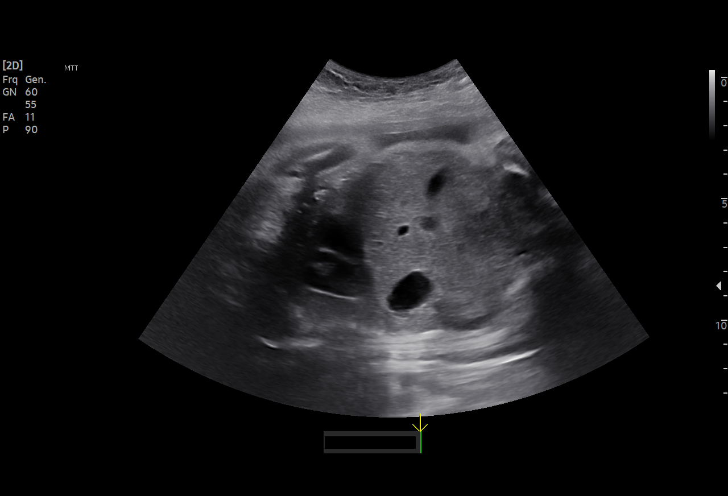
[im 85/105]
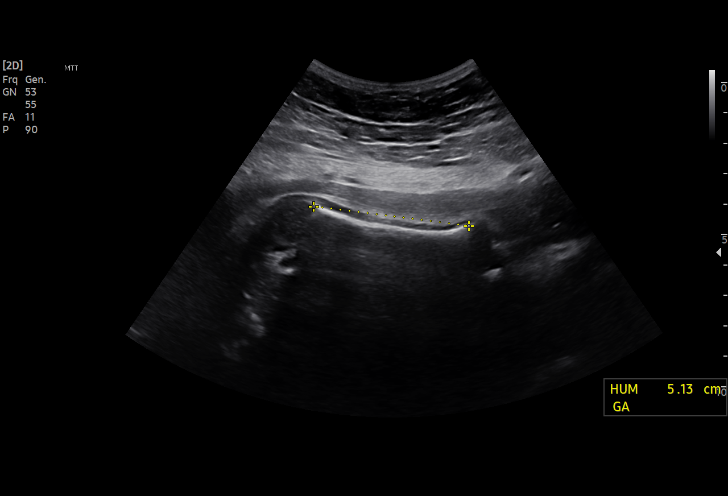
[im 93/105]
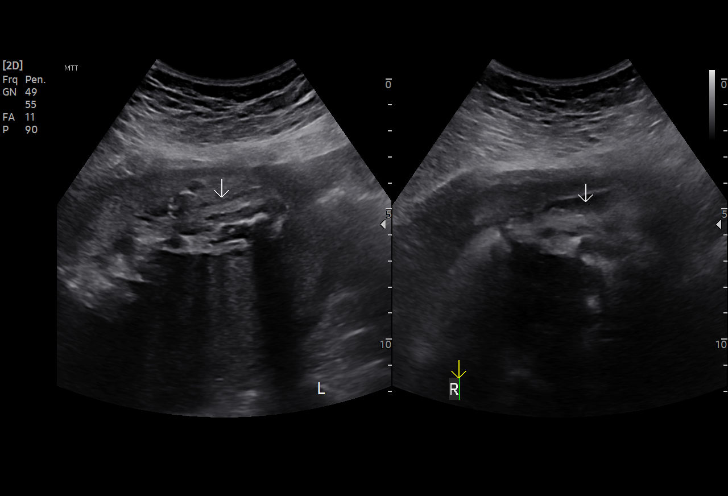
[im 101/105]
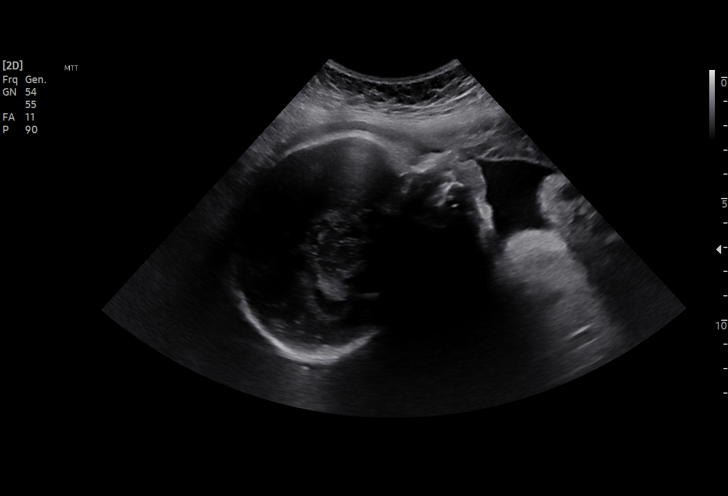

[12 of 28 positions shown; findings below may reference images not displayed]

[REDACTED]-
                   Faculty Physician

 2  US MFM UA CORD DOPPLER                76820.02    CIRILO CONLON
    DEEQA RAYAAN/NONSTRESS

Indications

 32 weeks gestation of pregnancy
 Previous cesarean delivery, antepartum
 Small for gestational age fetus affecting
 management of mother
 Encounter for antenatal screening for
 malformations
Fetal Evaluation

 Num Of Fetuses:         1
 Fetal Heart Rate(bpm):  136
 Cardiac Activity:       Observed
 Presentation:           Breech
 Placenta:               Posterior
 P. Cord Insertion:      Visualized, central

 Amniotic Fluid
 AFI FV:      Within normal limits

 AFI Sum(cm)     %Tile       Largest Pocket(cm)
 9.5             13
 RUQ(cm)       RLQ(cm)       LUQ(cm)        LLQ(cm)
 3.7           3             0
Biophysical Evaluation

 Amniotic F.V:   Pocket => 2 cm             F. Tone:        Observed
 F. Movement:    Observed                   N.S.T:          Reactive
 F. Breathing:   Observed                   Score:          [DATE]
Biometry

 BPD:      74.1  mm     G. Age:  29w 5d        < 1  %    CI:        72.32   %    70 - 86
                                                         FL/HC:      21.9   %    19.1 -
 HC:      277.2  mm     G. Age:  30w 2d        < 1  %    HC/AC:      1.03        0.96 -
 AC:      270.2  mm     G. Age:  31w 1d         15  %    FL/BPD:     81.8   %    71 - 87
 FL:       60.6  mm     G. Age:  31w 4d         16  %    FL/AC:      22.4   %    20 - 24
 HUM:      52.3  mm     G. Age:  30w 3d         15  %
 CER:      42.6  mm     G. Age:  33w 5d         60  %
 LV:        6.2  mm
 CM:        8.5  mm

 Est. FW:    1971  gm    3 lb 12 oz       9  %
OB History

 Gravidity:    1
Gestational Age

 LMP:           32w 3d        Date:  01/29/20                 EDD:   11/04/20
 U/S Today:     30w 5d                                        EDD:   11/16/20
 Best:          32w 3d     Det. By:  LMP  (01/29/20)          EDD:   11/04/20
Anatomy

 Cranium:               Appears normal         Aortic Arch:            Not well visualized
 Cavum:                 Appears normal         Ductal Arch:            Not well visualized
 Ventricles:            Appears normal         Diaphragm:              Appears normal
 Choroid Plexus:        Not well visualized    Stomach:                Appears normal, left
                                                                       sided
 Cerebellum:            Appears normal         Abdomen:                Appears normal
 Posterior Fossa:       Appears normal         Abdominal Wall:         Not well visualized
 Nuchal Fold:           Not well visualized    Cord Vessels:           Appears normal (3
                                                                       vessel cord)
 Face:                  Orbits nl; profile not Kidneys:                Appear normal
                        well visualized
 Lips:                  Not well visualized    Bladder:                Appears normal
 Thoracic:              Appears normal         Spine:                  Not well visualized
 Heart:                 Pericardial            Upper Extremities:      Visualized Limited
                        effusion
 RVOT:                  Appears normal         Lower Extremities:      Visualized Limited
 LVOT:                  Appears normal

 Other:  Fetus appears to be female. Technically difficult due to advanced GA
         and fetal position.
Doppler - Fetal Vessels
 Umbilical Artery
  S/D     %tile      RI    %tile      PI    %tile     PSV    ADFV    RDFV
                                                    (cm/s)
  2.31       28    1.43   > 97.5    2.08   > 97.5    22.49      No      No

Cervix Uterus Adnexa

 Cervix
 Not visualized (advanced GA >91wks)

 Uterus
 No abnormality visualized.

 Right Ovary
 Within normal limits. No adnexal mass visualized.

 Left Ovary
 Within normal limits. No adnexal mass visualized.

 Cul De Sac
 No free fluid seen.

 Adnexa
 No abnormality visualized.
Impression

 G4 P3. Patient is here for a second opinion ultrasound.  On
 ultrasound performed on 08/14/2020, the estimated fetal
 weight was at the 3rd percentile.
 On first trimester screening, the risks of fetal aneuploidies
 were not increased.  Patient reports no chronic medical
 conditions.  Blood pressure today at our office is 118/70
 mmHg.

 Obstetric history is significant for 2 vaginal deliveries followed
 by a cesarean section.  Her children weighed between 5+
 and 6+ pounds at birth.  All are in good health.

 On today's ultrasound, amniotic fluid is normal and good fetal
 activity seen.  The estimated fetal weight is at the 9th
 percentile.  Head circumference measurement is at between -
 2 and -1 SD (no evidence of microcephaly).  Mild
 physiological pericardial effusion is seen.  Fetal anatomical
 survey appears normal but limited by advanced gestational
 age.Antenatal testing is reassuring. Umbilical artery Doppler
 PI is increased (above the 95th percentile). S/D ratio is
 normal. NST is reactive.  BPP [DATE].

 I explained the finding of fetal growth restriction based on the
 estimated fetal weight.  I also informed her that about 20% of
 constitutionally small fetus have similar findings.  However,
 we recommend weekly antenatal testing till delivery.
Recommendations

 -NST next week.
 -BPP and UA Doppler in 2 weeks and then weekly till delivery.
                 Nonas, Fillipos

## 2023-09-03 DIAGNOSIS — Z3009 Encounter for other general counseling and advice on contraception: Secondary | ICD-10-CM | POA: Diagnosis not present

## 2023-09-03 DIAGNOSIS — E663 Overweight: Secondary | ICD-10-CM | POA: Diagnosis not present

## 2023-09-03 DIAGNOSIS — Z01419 Encounter for gynecological examination (general) (routine) without abnormal findings: Secondary | ICD-10-CM | POA: Diagnosis not present

## 2023-11-05 DIAGNOSIS — Z1389 Encounter for screening for other disorder: Secondary | ICD-10-CM | POA: Diagnosis not present

## 2023-11-05 DIAGNOSIS — Z820 Family history of epilepsy and other diseases of the nervous system: Secondary | ICD-10-CM | POA: Diagnosis not present

## 2023-11-05 NOTE — Progress Notes (Signed)
 11/05/2023   DNA-extract-and-hold - obtained  Child - epilepsy eval - Heidi [name] [id number]  Mother - Dinah Lupa  MRN [id number] Father - Darin Lou [name] MRN [id number]

## 2024-01-15 DIAGNOSIS — Z3200 Encounter for pregnancy test, result unknown: Secondary | ICD-10-CM | POA: Diagnosis not present

## 2024-01-15 DIAGNOSIS — Z3009 Encounter for other general counseling and advice on contraception: Secondary | ICD-10-CM | POA: Diagnosis not present

## 2024-02-26 ENCOUNTER — Other Ambulatory Visit: Payer: Self-pay

## 2024-02-26 ENCOUNTER — Ambulatory Visit

## 2024-02-26 ENCOUNTER — Telehealth

## 2024-02-26 DIAGNOSIS — O0991 Supervision of high risk pregnancy, unspecified, first trimester: Secondary | ICD-10-CM

## 2024-02-26 DIAGNOSIS — O0990 Supervision of high risk pregnancy, unspecified, unspecified trimester: Secondary | ICD-10-CM | POA: Insufficient documentation

## 2024-02-26 DIAGNOSIS — Z3A10 10 weeks gestation of pregnancy: Secondary | ICD-10-CM | POA: Diagnosis not present

## 2024-02-26 NOTE — Patient Instructions (Signed)
 Safe Medications in Pregnancy   Acne:  Benzoyl Peroxide  Salicylic Acid   Backache/Headache:  Tylenol: 2 regular strength every 4 hours OR               2 Extra strength every 6 hours   Colds/Coughs/Allergies:  Benadryl (alcohol free) 25 mg every 6 hours as needed  Breath right strips  Claritin  Cepacol throat lozenges  Chloraseptic throat spray  Cold-Eeze- up to three times per day  Cough drops, alcohol free  Flonase (by prescription only)  Guaifenesin  Mucinex  Robitussin DM (plain only, alcohol free)  Saline nasal spray/drops  Sudafed (pseudoephedrine) & Actifed * use only after [redacted] weeks gestation and if you do not have high blood pressure  Tylenol  Vicks Vaporub  Zinc lozenges  Zyrtec   Constipation:  Colace  Ducolax suppositories  Fleet enema  Glycerin suppositories  Metamucil  Milk of magnesia  Miralax  Senokot  Smooth move tea   Diarrhea:  Kaopectate  Imodium A-D   *NO pepto Bismol   Hemorrhoids:  Anusol  Anusol HC  Preparation H  Tucks   Indigestion:  Tums  Maalox  Mylanta  Zantac  Pepcid   Insomnia:  Benadryl (alcohol free) 25mg  every 6 hours as needed  Tylenol PM  Unisom, no Gelcaps   Leg Cramps:  Tums  MagGel   Nausea/Vomiting:  Bonine  Dramamine  Emetrol  Ginger extract  Sea bands  Meclizine  Nausea medication to take during pregnancy:  Unisom (doxylamine succinate 25 mg tablets) Take one tablet daily at bedtime. If symptoms are not adequately controlled, the dose can be increased to a maximum recommended dose of two tablets daily (1/2 tablet in the morning, 1/2 tablet mid-afternoon and one at bedtime).  Vitamin B6 100mg  tablets. Take one tablet twice a day (up to 200 mg per day).   Skin Rashes:  Aveeno products  Benadryl cream or 25mg  every 6 hours as needed  Calamine Lotion  1% cortisone cream   Yeast infection:  Gyne-lotrimin 7  Monistat 7    **If taking multiple medications, please check labels to avoid  duplicating the same active ingredients  **take medication as directed on the label  ** Do not exceed 4000 mg of tylenol in 24 hours  **Do not take medications that contain aspirin or ibuprofen            [facility] Pediatric Providers  [place] ([address]) [facility] Manson Passey, MD; Deirdre Priest, MD; Lum Babe, MD; Leveda Anna, MD; McDiarmid, MD; Jerene Bears, MD [address] [place] [place], [place] [address] [phone] Mon-Fri 8:30-12:30, 1:30-5:00  Providers come to see babies during newborn hospitalization Only accepting infants of Mother's who are seen at [facility] or have siblings seen at   [facility] Medicaid - Yes; Tricare - Yes   [facility], MD [address] [place], [place] [address] [phone] Mon, Tue, Thur, Fri 8:30-5:00, Wed 10:00-7:00 (closed 1-2pm daily for lunch) [facility] residents with no insurance.  [place] [employer] only with Medicaid/insurance; Tricare - no  [facility]) - Tim and [facility], MD; Manson Passey, MD; Ave Filter, MD; Luna Fuse, MD; Kennedy Bucker, MD; Florestine Avers, MD; Melchor Amour, MD; Yetta Barre,  MD; Konrad Dolores, MD; Kathlene November, MD; Jenne Campus, MD; Wynetta Emery, MD; Duffy Rhody, MD; Gerre Couch, NP [address] [place], [place], [place] [address] [phone] Mon, Tue, Thur, Fri 8:30-5:30, Wed 9:30-5:30, Sat 8:30-12:30 Only accepting infants of first-time parents or siblings of current  patients Hospital discharge coordinator will make follow-up appointment Medicaid - yes; Tricare - yes  [place] ([address]) [facility], MD; Earlene Plater, MD; Jamesetta Orleans, MD; Alvera Novel, MD; Rana Snare, MD; [facility], MD; De Graff, MD; Hosie Poisson, MD; Mayford Knife, MD [address], [place], [place] [address] [phone] Mon-Fri 8:30-5:00, closed for lunch 12:30-1:30; Sat-Sun 10:00-1:00 Accepting Newborns with commercial insurance only, must call prior to  delivery to be accepted into  practice.  Medicaid - no, Tricare - yes   [facility] [address] [place], [place] [address] [phone] or [phone] Mon to Fri 8am to 10pm, Sat 8am to 1pm (virtual only on weekends) Only accepts Medicaid Healthy Blue pts  [facility] ([facility]) - Pediatrics at Elige Radon, MD; Sabino Dick, MD; Quitman Livings, MD; Betha Loa, NP; Claretha Cooper, MD; Lelon Perla, MD [address] [place] [place], [place] [address] [phone] Mon-Fri 8:30-5:30 Medicaid - yes, Tricare - yes  [place] [phone]) [facility] Mowers, MD [address], [place], [place] [address] [phone] Iona Hansen, Wed Fri 8:30-5:00, Sat 8:30-12:00, Closed Thursdays Accepting siblings of established patients and first time mom's if you call prenatally Medicaid- yes; Tricare - yes  [facility] Lutricia Feil, [place]; Tracie Harrier, MD; Rusty Aus; Scifres, PA; Wynelle Link, MD; Azucena Cecil, MD;  [address] [place], [place] [address] [phone] Mon-Fri 8:30-5:00, closed for lunch 1-2 Only accepting newborns of established patients Medicaid- no; Tricare - yes  [facility] [phone]) [facility] Morene Crocker, MD; [address], [place], [place] [address] [phone] Mon-Fri 8:00-5:00 Medicaid - No; Tricare - Yes  [facility] at [facility], [place]; [place], [place] [address], [place], [place] [address] [phone] Mon-Fri 8:00-5:00 Medicaid - No, Tricare - Yes  [facility] Cardell Peach, MD; Nash Dimmer, MD; [place], [place] [address] [place], [place] [address] [phone]  Mon-Fri 8:00-5:00 Medicaid - No; Tricare - Yes  [facility] [address], [place], [place] [address] [phone] Mon-Fri 8:30-5:00 (lunch 12:00-1:00) Medicaid -Yes; Tricare - Yes  [facility], MD [address] [place],  [place], [place] [address] [phone] Mon-Fri 8:00-5:00 Seeing newborns of current patients only. No new patients Medicaid - No, Tricare - yes  Nature conservation officer at [place] [address] MD [address] [place], [place] [address] [phone] Mon-Fri 8:00-5:00 Medicaid -yes as secondary coverage only; Tricare - yes  [facility] [place], [place]; [place], [place]; Avis Epley, MD; Vonna Kotyk, MD; Clance Boll, MD; [place], [place]; [place], NP; Vaughan Basta, MD; [place], MD [address] [place], [place] [address] [phone] Mon-Fri 8:30-5:00, Sat 9:00-11:00 Accepts commercial insurance ONLY. Offers free prenatal information sessions for families. Medicaid - No, Tricare - Call first  [facility] [place], MD; [place], [place]; [place], [place]; [place], [place] [address] [place] [address] [phone] Mon-Fri 7:30-5:30 Medicaid - Yes; Ailene Rud yes  [place] [phone] & [phone])  [facility], MD [address] [place], [place] [address] [phone] Mon-Thur 8:00-6:00, closed for lunch 12-2, closed Fridays Medicaid - yes; Tricare - no  [facility] Dareen Piano, NP; Cyndia Bent, MD; [place], [place]; [place], [place] [address], [place], [place] [address] [phone] Mon-Fri 7:30-4:30 Medicaid - yes, Tricare - yes  [facility]  Juanito Doom, MD; Janene Harvey, NP; Vonita Moss, MD; Donn Pierini, NP [address], [place], [place] [address] [phone] Mon-Fri 8:30-5:00, closed for lunch 1-2, Sat 8:30-12:00 - sick visits only Providers come to see  babies at [facility] Only accepting newborns of siblings and first time parents ONLY if who have met with office prior to delivery Medicaid -Yes; Tricare - yes  [facility] - [place], [place]; Spero Geralds, NP; Earlene Plater, MD; Lucretia Roers, MD:  [address], [place], [place] [address] [phone] Mon- Fri 8:00-5:00, Sat 9:00-12:00 - sick  visits only Accepting siblings of established patients and first time mom/baby Medicaid - Yes; Tricare - yes Patients must have vaccinations (baby vaccines)  [place] [phone] & [phone])  Adult nurse HealthCare at [facility] [address] [place], [place] [address] [phone] Mon-Fri 8:00-5:00 Medicaid - no; Tricare - yes  [facility] [place], MD; [place], [place]; [place], [place] [address], [place], [place] [address] [phone] Mon-Fri 8:00-5:00 Medicaid- yes; Tricare - yes  [facility] - Ardeen Jourdain, MD; Yetta Barre, NP; [place], [place] [address] [place], [place], [place] [address] [phone] Mon-Fri 8:00-5:00 Medicaid - Yes; Tricare - yes  [address] [place] [phone])  [facility] [place], [place]; [place], [place]; Eddie Candle, MD; Normand Sloop, MD; [place], NP; [place], DO; [place], [place]; Constance Goltz, MD; Ruthann Cancer, MD; Vear Clock, MD; [place], [place]; [place], [place]; [place], [place] [address] [address] [facility] [address], [place], [place] [address] [phone] Mon-Fri 8:30-5:00, Sat 9:00-12:00 - sick only Please register online [email or web address] then schedule online or call office Medicaid-Yes; Tricare -yes  [facility] - [place]  [place], MD; Romualdo Bolk, MD; Kirby, MD; [place], NP; [place], [place]; Antonietta Barcelona, MD; Mayford Knife, NP; Shelva Majestic, MD [address], [place], [place] [address] [phone] Mon-Fri 8:00-5:30, Sat&Sun by appointment (phones open at 8:30) Medicaid - Yes; Tricare - yes  [place] [phone] & [phone]) [facility] Pediatrics Mariel Aloe; [place], MD; Roger Shelter, MD; Arvilla Market, NP; [place], DO [address], [place], [place] [address] [phone] M-F 8:00 - 5:15, Sat/Sun 9-12 sick visits only Medicaid - No; Tricare - yes  [facility]  [place], PA-C; [place], PA-C; [place], DO; [place], PA-C; [place], PA-C; Roselyn Bering, MD [address] [place], [place] [address] [phone] Mon-Thur 8:00-7:00, Fri 8:00-5:00 Accepting Medicaid for 13 and under only   [facility] - Family Medicine at [place] [facility] [place]) [place], [place]; List, FNP; Berneda Rose, MD; Luther Redo, PA-C; Lavonia Drafts, MD; Kellie Simmering, FNP; Genevie Cheshire, FNP; Evaristo Bury, MD; Berneda Rose, MD [phone] [address] [place], [place] [address] [phone] Mon-Fri 8:30-5:30 Medicaid - Yes; Tricare - yes  [facility] - [address]  [place], [place]; Whitney Post, MD; Hennie Duos, MD; Wynne Dust, MD; [place], NP [address], [place], [place] [address] [phone] Mon-Thur 8:00-5:30, Fri 8:00-5:00, Sat 9:00-12:00 Medicaid - yes, Tricare - yes  [place] [phone])  [facility], [place]; Lenise Arena, MD; [place], [place] [address], [place], [place] [address] [phone] Mon-Fri 8:00-5:00, closed for lunch 12-1 Medicaid - No; Tricare - yes  Nature conservation officer at [facility], MD [address] [place], [place] [address] [phone] Mon-Fri 8:00-5:00 Medicaid - No; Tricare - yes  [facility], MD; Tami Ribas, MD; Mariam Dollar, MD; Yetta Barre, MD [address], [place], [place] [address] [phone] Mon-Fri 8:00-5:00 Medicaid- Yes; Tricare - yes  [place] [phone])  Adult nurse HealthCare at [facility], [place]; [place], MD [address] [place], [place], [place] [address] [phone] Mon-Fri 8:00-5:00 Medicaid - No; Tricare - yes  [facility] - CPNP [address] [place], [place] [address] [phone] Mon-Weds 8:00-6:00, Thurs-Fri 8:00-5:00, Sat 9:00-12:00 Medicaid - yes; Tricare - yes   [facility] Katharina Caper, MD; [place], [place] [address] [place], [place] [address] [phone] Mon-Fri 8:00-5:00 Medicaid - yes; Tricare - yes  [employer]  Pediatric Providers  [facility] [address], [place], [place] [address] [phone] Sheral Flow: 8am -8pm, Tues, Weds: 8am - 5pm; Fri: 8-1 Medicaid - Yes; Tricare - yes  [facility] Rachel Bo, MD; Laural Benes, MD; Anner Crete, MD; [place], [place]; [place], [place] [address], [place], [place] [address] [phone] M-F 8:30 - 5:00 Medicaid - Call office; Tricare -yes  [facility] Edson Snowball, MD; Shanon Rosser, MD, Chelsea Primus, MD; Shirlyn Goltz, PNP; Wardell Heath, NP [phone] [address], [place], [place] [address] [phone] M-F 8:30 - 5:00, Sat/Sun 8:30 - 12:30 (sick visits) Medicaid - Call office; Tricare -yes  [facility] Melvyn Neth, MD; Karl Luke, PNP; Princess Bruins, MD; [place], [place]; [place], NP; Cynda Familia [address], [place], [place] [address] [phone] M-F 8:30 - 5:00 Medicaid - Call office; Tricare - yes  [facility] Jesusita Oka, MD; Dierdre Highman, MD; Earnest Conroy, MD; Timothy Lasso, MD; [place], MD [phone] [address] [place], [place] [address] [phone] M-Thur: 8:00 - 5:00; Fri: 8:00 - 4:00 Medicaid - yes; Tricare - yes  [facility] [address] [place], [place] [address] [phone] M-F: 8:30- 5:00, closed for lunch 12:30 - 1:00 Medicaid - yes; Tricare -yes  [facility] [address] [place], [place] [address] [phone] M-F 8:00 - 5:00 Medicaid - yes; Tricare - yes  [facility] [place], DO; [place], DO; [place], NP [address], [place], [place] [address] [phone] M-F 8:00 - 5:00, Closed 12-1 for lunch Medicaid - Call; Tricare - yes  [facility] Meredith Mody, MD [address], [place], [place] [address] [phone] M-F: 8:00-5:00, Sat: 8:00 - noon Medicaid - call; Tricare -yes  [facility] Pediatric Providers  [facility] [place] [facility] [place], [place] [address] [place], [place], [place] [address] [phone] M-W: 8:00-5:00, Thur: 8:00 -  7:00, Fri: 8:00 - noon Medicaid - yes; Tricare - yes  [facility] - Quay Burow, FNP [address], [place], [place] [address] [phone] M-F 8:00 - 5:00, Closed for lunch 12-1 Medicaid - yes; Tricare - yes  [facility] Pediatric Providers  [facility] Primary Care at [place], [place], Alinda Money, MD, [place], FNP-C [address], [facility], [address], [place], [place] [address] [phone] M-T 8:00-5:00, Wed-Fri 7:00-6:00 Medicaid - Yes; Tricare -yes  [facility] at [facility], DO; [address] Saner [place], [place] [address] [phone] M-F 8:00 - 5:00, closed for lunch 12-1 Medicaid - Yes; Tricare - yes  [facility] Pediatrics and Internal Medicine  Zachery Dauer, MD; Gladstone Lighter, MD; Collie Siad, MD; Freda Jackson, MD; Rich Number, MD; Darryl Nestle, MD; Melinda Crutch, MD, Audria Nine, MD; Tawanna Cooler, MD; Steffanie Dunn, MD; Byrd Hesselbach, MD; Lucretia Roers, MD [address] [place], [place] [address] [phone] M-F 8:00-5:00 Medicaid - yes; Tricare - yes  [facility] [place], MD (speaks [place] and Hindi) [address] [place], [place] [address] [phone] M-F: 8:30 - 5:00, closed 12:30 - 1 for lunch Medicaid - Yes; Tricare -yes  [facility] Pediatric Providers  [facility] Shanda Bumps, MD; Chanetta Marshall, MD; Laurell Josephs,  MD [address] [place], [place] [address] [phone] M-Th: 8:00 - 5:30, Fri: 8:00 - 12:00 Medicaid - yes; Tricare - yes  [facility] Pediatrics at [facility], NP; Thora Lance, MD; Orrin Brigham, MD [phone] [address] [place], [place] [address] [phone] M-F: 8:00 - 5:00 Medicaid - yes; Tricare - yes  [facility] [place], NP; Orson Slick, NP; Salley Scarlet, NP; Linton Flemings, MD; Mayford Knife, MD, [place], NP, Emelda Fear, MD; Nida Boatman [address], [place], [place] [address] [phone] M-F: 8:30 - 5:30p Medicaid - yes; Tricare - yes Other locations available as well  [facility], MD; Andrey Campanile, MD; Neville Route, PA-C [address], [place], [place] [address] [phone] M-W: 8:00am - 7:00pm, Thurs: 8:00am - 8:00pm; Fri: 8:00am - 5:00pm, closed daily from 12-1 for lunch Medicaid - yes; Tricare - yes  [facility] Pediatric Providers  [facility] at Levin Erp, MD; Aggie Cosier, FNP; Bland Span, MD; Tristan Schroeder, MD; [place], PNP; Alesia Banda; [place], [place]; Julian Reil, MD;  [address], [place], [place] [address] [phone] Judie Petit - Caleen Essex: 8am - 5pm, Sat 9-noon Medicaid - Yes; Tricare -yes  [facility] at Jaclynn Guarneri, MD; Yetta Barre, FNP; Lilian Kapur, MD; Mariam Dollar, MD [address] [place], [place] [phone] M-F 8:00 - 5:00 Medicaid - call; Tricare - yes  [facility]- Cruz Condon, MD; [place], [place]; Delora Fuel, MD; Dareen Piano, MD; Trudee Grip, MD; Kizzie Ide, MD; Zebedee Iba; Birdena Crandall, MD; Hinton Dyer, MD; [place], MD [address], [place], [place] [address] [phone] M-F 8:00am - 5:00pm; Sat. 9:00 - 11:00 Medicaid - yes; Tricare - yes  [facility] at [facility], MD [address] [place], [place] [address] [phone] M-F 8:00 - 5:00 Medicaid - [place] Medicaid only; Tricare - yes  [facility] - Illene Bolus, MD; Earlene Plater, [place]; Kenyon Ana, MD [address], [place], [place] [address] [phone] M-F 8:00 - 5:00 Medicaid - yes; Tricare - yes  [facility] [address] Pediatrics - Lind Covert, MD; Manson Passey, MD, [facility], MD, [place], MD; [place], MD; Katrinka Blazing, MD; [address] [place], [place] [address] [phone] M-F: 8-5 Medicaid - yes; Tricare - yes  [facility] - Henrietta Hoover, [place]; [place], MD; [address] [place], [place] [address] [phone] M-F 8-5 Medicaid - yes; Tricare - yes  [address] [place] Darrol Poke, MD; Tami Ribas, MD; [place], MD; [place], DNP;  [place], PNP [address], [place], [place] [address] [phone] M-F 8-5 Medicaid - yes; Tricare - yes  [facility] Internal [facility] and Pediatrics Delories Heinz, MD; Adrienne Mocha; Ala Bent, MD [address] [place], [place] [address] [phone] M-F 7am - 5 pm Medicaid - call; Tricare - yes  [facility] [place], [place]; Fredia Beets, MD; Roxan Hockey, MD [address] [place], [place] [address] [phone] M-F 8-5 Medicaid - yes; Tricare - yes  [facility] Kae Heller, MD; Sheliah Hatch, MD; Mayford Knife, FNP; Shon Baton, FNP; Tyron Russell, FNP; Ishmael Holter; [facility] - FNP [address] [place], [place] [address] [phone] M-F 8-5 Medicaid- yes; Tricare - yes  [facility] - Betsy Coder, [place] and [place] Guest, MD; Terrial Rhodes, MD; Hulda Humphrey, MD; Roseanne [place], MD; [place], [place]; Ala Dach, MD; Fredia Beets, MD; Dimple Casey, MD [address], [place], [place] [address] [phone] M-F: 8-5, Sat: 9-4, Sun 9-12 Medicaid - yes; Tricare - yes  [facility] [place], PNP; [place], PNP [address] [place],  [place] [address] [phone] M-F 8 - 5, closed 12-1 for lunch Medicaid - yes; Tricare - yes  [facility] Kathyrn Lass, MD; Hal Neer, MD; Dimple Casey, MD; [place], DO [address] [place], [place] [address] [phone] M-F 8- 5:30 Medicaid - yes; Tricare - yes  [facility]s - Biagio Quint, MD; Rosalia Hammers, MD; Gwenith Daily, MD [address] [place], [place] [address] [phone] Judie Petit: Nicholas Lose; Tues-Fri: 8-5; Sat: 9-12 Medicaid - yes; Tricare - yes  [facility] - Bobbye Morton, MD; Daphane Shepherd, MD; Chestine Spore, MD; Haskell Riling, MD; Kate Sable, MD [address] [place], [place] [address] [phone] Judie PetitMarland Kitchen Nicholas LoseFrancee Nodal: 8-5; Sat: 8:30-12:30 Medicaid - yes;  Tricare - yes  [facility] Heck[facility]s - Beckey Rutter, MD; [place], [place] [address], [place], [place] [address] [phone] Mon-Fri: 8-5 Medicaid - yes; Tricare - yes  [facility] Pediatrics - [place], CPNP; [place], [place]; Dimple Casey, MD; Alisa Graff, MD; Cephus Shelling, MD; [address], [place], [place] [address] [phone] M-F: 8-5, closed 1-2 for lunch Medicaid - yes; Tricare - yes  [facility] Pediatrics - [facility] [place], [place]; [place], [place]; Katrinka Blazing, MD; [place], CPNP; [place], [place]; [place], MD; Earlene Plater, MD [address], [place], [place] [address] [phone] M-Thurs: Nicholas Lose; Fri: 8-6; Sat: 9-12; Sun 2-4 Medicaid - yes; Tricare - yes  [facility] Georgeanna Lea, MD; Evette Cristal, MD; Shea Stakes, FNP; Earney Mallet, DO; [address] [place], [place] [address] [phone] M-F: 8-5 Medicaid - yes; Tricare - yes  [facility] Pediatric Providers  [facility] Family Medicine -Collene Mares, MD; [place], NP [address], [place], [place] [address] [phone] M - Fri: 8am - 5pm, closed for lunch 12-1 Medicaid - Yes; Tricare - yes  [facility] Elinor Parkinson, MD; Victory Dakin, MD; [place], DO; Vinocur, MD;Hall, PA; Clent [place], [place]; Orvan Falconer, NP [phone] [address] [place], [place] [address] [phone] M-F 8:00 - 5:00, Sat 8:00 - 11:30 Medicaid - yes; Tricare - yes  [facility] Welton Flakes, MD; [place], MD, [address], MD, [place], MD, [place], MD; [place], NP; [place], [place];  [address] [place], [place] [address] [phone] M-F 8:10am - 5:00pm Medicaid - yes; Tricare - yes  [facility] Eustaquio Boyden, MD; [place], NP [address] [place], [place] [address] [phone] M-F 8:00 - 5:00 Medicaid - [place] Medicaid only; Tricare - yes  [facility] - [facility] [address] [place], MD; [place], NP [address], [place], [place] [address] [phone] M-F 8:00 - 5:00; Closed for lunch 12 - 1:00 Medicaid - yes; Tricare - yes  [facility] Belva Crome, MD; Jonita Albee, FNP [address], [place], [place] [address] [phone] Mon 9-5; Tues/Wed 10-5; Thurs 8:30-5; Fri: 8-12:30 Medicaid - yes; Tricare - yes  [facility] Pediatric Providers  [facility]  [place], MD; [place], [place] [address] [place], [place] [address] [phone] phone [phone] fax M-F 7:15 - 4:30 Medicaid - yes; Tricare - yes  [place] -  Pediatrics Karilyn Cota, MD; [place], DO [address], [place], [place] [address] [phone] M-Fri: 8:30 - 5:00, closed for lunch everyday noon - 1pm Medicaid - Yes; Tricare - yes  [facility] Burdine, MD; Reuel Boom, MD; Dimas Aguas, MD; Neita Carp, MD; [place], [place]; Bonnita Nasuti, [place]; [place], [place]; [place],  PA; [place], [place] [address] [place], [place] [address] [phone] M-Thurs: 7:30am - 7:00pm; Friday 7:30am - 4pm; Sat: 8:00 - 1:00 Medicaid - Yes; Tricare - yes  [place] [facility] Nor[facility] Morton, MD; Conni Elliot, MD; Carroll Kinds, MD; [place], DO [address], [place], [place] [address] [phone] M-Thur: 8:00 - 5:00, Fri: 8:00 - Noon Medicaid - yes; Tricare - yes No  [facility]  [place] [facility] [place], MD; Nadine Counts, DO; [place], NP; Daphine Deutscher, NP; Lequita Halt, NP; Ellamae Sia, NP; Reginia Forts, NP; Darlyn Read, MD; [place], [place] [address], [place], [place] [address] [phone] M-F 8:00 - 5:00 Medicaid - yes; Tricare - yes  [facility], FNP-C; [place], FNP-C [address] [place], [place] [address] [phone] M, W, R 8:00-5:00, Tues: 8:00am - 7:00pm; Fri 8:00 - noon Medicaid - Yes; Tricare - yes  [facility], MD [address] [place], [place] [address] [phone]  M-Thurs 8:30-5:30, Fri: 8:30-12:30pm Medicaid - Yes; Tricare - N    Considering Waterbirth? Guide for patients at [facility]) Why consider waterbirth? Gentle birth for babies  Less pain medicine used in labor  May allow for passive descent/less pushing  May reduce perineal tears  More mobility and instinctive maternal position changes  Increased maternal relaxation   Is waterbirth safe? What are the risks of infection, drowning or other complications? Infection:  Very low risk (3.7 % for tub vs 4.8% for bed)  7 in 8000 waterbirths with documented infection  Poorly cleaned equipment most common cause  Slightly lower group B strep transmission rate  Drowning  Maternal:  Very low risk  Related to seizures or fainting  Newborn:  Very low risk. No evidence of increased risk of respiratory problems in multiple large studies  Physiological protection from breathing under water  Avoid underwater birth if there are any fetal complications  Once baby's head is out of the water, keep it out.  Birth complication  Some reports of cord trauma, but risk decreased by bringing baby to surface gradually  No evidence of increased risk of shoulder dystocia. Mothers can usually change positions faster in water than in a bed, possibly aiding the maneuvers to free the shoulder.   There are 2 things you MUST do to have a waterbirth with [facility]: Attend a waterbirth class at [facility] at [facility]   3rd Wednesday of every month from 7-9 pm (virtual during COVID) [employer] at [email or web address] or [email or web address] or by calling [phone] Bring [place] the certificate from the class to your prenatal appointment or send via MyChart Meet with a midwife at 36 weeks* to see if you can still plan a waterbirth and to sign the consent.   *We also recommend that you schedule as many of your prenatal  visits with a midwife as possible.    Helpful information: You may want to bring a bathing suit top to the hospital to wear during labor but this is optional.  All other supplies are provided by the hospital. Please arrive at the hospital with signs of active labor, and do not wait at home until late in labor. It takes 45 min- 1 hour for fetal monitoring, and check in to your room to take place, plus transport and filling of the waterbirth tub.    Things that would prevent you from having a waterbirth: Premature, <37wks  Previous cesarean birth  Presence of thick meconium-stained fluid  Multiple gestation (Twins, triplets, etc.)  Uncontrolled diabetes or gestational diabetes requiring medication  Hypertension diagnosed in pregnancy or preexisting hypertension (gestational hypertension, preeclampsia, or chronic hypertension) Fetal growth restriction (your baby measures less than 10th percentile on ultrasound) Heavy vaginal bleeding  Non-reassuring fetal heart rate  Active infection (MRSA, etc.). Group B Strep is NOT a contraindication for waterbirth.  If your labor has to be induced and induction method requires continuous monitoring of the baby's heart rate  Other risks/issues identified by your obstetrical provider   Please remember that birth is unpredictable. Under certain unforeseeable circumstances your provider may advise against giving birth in the tub. These decisions will be made on a case-by-case basis and with the safety of you and your baby as our highest priority.

## 2024-02-26 NOTE — Progress Notes (Signed)
 New OB Intake  I connected with Timisha Maxfield  on 02/26/24 at  8:15 AM EDT by MyChart Video Visit and verified that I am speaking with the correct person using two identifiers. Nurse is located at [facility] and pt is located at home.  I discussed the limitations, risks, security and privacy concerns of performing an evaluation and management service by telephone and the availability of in person appointments. I also discussed with the patient that there may be a patient responsible charge related to this service. The patient expressed understanding and agreed to proceed.  I explained I am completing New OB Intake today. We discussed EDD of 06/04/2024, by Last Menstrual Period. Pt is [id number]. I reviewed her allergies, medications and Medical/Surgical/OB history.    Patient Active Problem List   Diagnosis Date Noted   IUGR (intrauterine growth restriction) affecting care of mother 09/30/2020   Oligohydramnios antepartum 09/30/2020   Cesarean delivery delivered 09/30/2020   History of cesarean delivery affecting pregnancy 09/29/2020   Oligohydramnios 09/28/2020   Pregnancy affected by fetal growth restriction 09/14/2020     Concerns addressed today  Delivery Plans Plans to deliver at [facility] Discussed the nature of our practice with multiple providers including residents and students. Due to the size of the practice, the delivering provider may not be the same as those providing prenatal care.   Patient is interested in water  birth.  MyChart/Babyscripts MyChart access verified. I explained pt will have some visits in office and some virtually. Babyscripts instructions given and order placed. Patient verifies receipt of registration text/e-mail. Account successfully created and app downloaded. If patient is a candidate for Optimized scheduling, add to sticky note.   Blood Pressure Cuff/Weight Scale Patient has private insurance; instructed to purchase blood pressure cuff and bring to first  prenatal appt. Explained after first prenatal appt pt will check weekly and document in Babyscripts.   Anatomy US  Explained first scheduled US  will be around 19 weeks. Anatomy US  scheduled for 04/29/2024 at 9:00am.  Is patient a CenteringPregnancy candidate?  Declined Declined due to Declined to say  Is patient a Mom+Baby Combined Care candidate?  Not a candidate   If accepted, confirm patient does not intend to move from the area for at least 12 months, then notify Mom+Baby staff  Is patient a candidate for Babyscripts Optimization?   First visit review I reviewed new OB appt with patient. Explained pt will be seen by Dr. Adriana Hopping at first visit. Discussed [facility] [place] genetic screening with patient. Panorama and Horizon.. Routine prenatal labs is needed at NOB visit.  Last Pap No results found for: "DIAGPAP"  Allene Ivan, CMA 02/26/2024  8:14 AM

## 2024-03-04 ENCOUNTER — Other Ambulatory Visit: Payer: Self-pay

## 2024-03-04 ENCOUNTER — Other Ambulatory Visit (HOSPITAL_COMMUNITY)
Admission: RE | Admit: 2024-03-04 | Discharge: 2024-03-04 | Disposition: A | Discharge status: Home / Self Care | Source: Ambulatory Visit | Attending: Family Medicine | Admitting: Family Medicine

## 2024-03-04 ENCOUNTER — Ambulatory Visit: Payer: Self-pay | Admitting: Family Medicine

## 2024-03-04 ENCOUNTER — Encounter: Payer: Self-pay | Admitting: Family Medicine

## 2024-03-04 VITALS — BP 106/73 | HR 87 | Wt 162.8 lb

## 2024-03-04 DIAGNOSIS — O0990 Supervision of high risk pregnancy, unspecified, unspecified trimester: Secondary | ICD-10-CM

## 2024-03-04 DIAGNOSIS — Z3A11 11 weeks gestation of pregnancy: Secondary | ICD-10-CM | POA: Diagnosis not present

## 2024-03-04 DIAGNOSIS — O0991 Supervision of high risk pregnancy, unspecified, first trimester: Secondary | ICD-10-CM

## 2024-03-04 DIAGNOSIS — O34219 Maternal care for unspecified type scar from previous cesarean delivery: Secondary | ICD-10-CM

## 2024-03-04 DIAGNOSIS — Z87898 Personal history of other specified conditions: Secondary | ICD-10-CM

## 2024-03-04 DIAGNOSIS — O09529 Supervision of elderly multigravida, unspecified trimester: Secondary | ICD-10-CM | POA: Insufficient documentation

## 2024-03-04 DIAGNOSIS — Z1332 Encounter for screening for maternal depression: Secondary | ICD-10-CM | POA: Diagnosis not present

## 2024-03-04 DIAGNOSIS — O09521 Supervision of elderly multigravida, first trimester: Secondary | ICD-10-CM | POA: Diagnosis not present

## 2024-03-04 NOTE — Progress Notes (Signed)
 Subjective:   Heidi [name] is a 38 y.o. [id number] at [redacted]w[redacted]d by LMP, early ultrasound being seen today for her first obstetrical visit.  Her obstetrical history is significant for advanced maternal age, intrauterine growth restriction (IUGR), and C-section x 2. Patient does not intend to breast feed. Pregnancy history fully reviewed.  Patient reports no complaints.  HISTORY: OB History  Gravida Para Term Preterm AB Living  5 4 3 1  0 4  SAB IAB Ectopic Multiple Live Births  0 0 0 0 4    # Outcome Date GA Lbr Len/2nd Weight Sex Type Anes PTL Lv  5 Current           4 Preterm 09/30/20 [redacted]w[redacted]d  4 lb 8.3 oz (2.05 kg) F CS-LTranv Spinal  LIV     Birth Comments: Late preterm, SGA female infant     Name: Heidi [name]     Apgar1: 9  Apgar5: 9  3 Term 01/22/15   7 lb 4 oz (3.289 kg) M CS-Unspec   LIV     Complications: Breech presentation  2 Term 07/10/12   7 lb 4 oz (3.289 kg) M Vag-Spont EPI  LIV  1 Term 01/05/07   6 lb 2 oz (2.778 kg) M Vag-Spont EPI  LIV   Last pap smear was  2024 and was abnormal - @ [facility] Past Medical History:  Diagnosis Date   Medical history non-contributory    Past Surgical History:  Procedure Laterality Date   CESAREAN SECTION     CESAREAN SECTION N/A 09/30/2020   Procedure: CESAREAN SECTION;  Surgeon: Othelia Blinks, MD;  Location: [facility] LD [facility]S;  Service: Obstetrics;  Laterality: N/A;   Family History  Problem Relation Age of Onset   Hypertension Mother    Diabetes Father    Social History   Tobacco Use   Smoking status: Never   Smokeless tobacco: Never  Vaping Use   Vaping status: Never Used  Substance Use Topics   Alcohol use: Never   Drug use: Never   No Known Allergies Current Outpatient Medications on File Prior to Visit  Medication Sig Dispense Refill   Prenatal Vit-Fe Fumarate-FA (PRENATAL VITAMIN PO) Take 1 tablet by mouth daily.  (Patient not taking: Reported on 03/04/2024)     No current facility-administered medications on file  prior to visit.     Exam   Vitals:   03/04/24 0930  BP: 106/73  Pulse: 87  Weight: 162 lb 12.8 oz (73.8 kg)   Fetal Heart Rate (bpm): 160  Pelvic Exam: Perineum: no hemorrhoids, normal perineum   Vulva: normal external genitalia, no lesions   Vagina:  normal mucosa, normal discharge   Cervix: no lesions and normal, pap smear done.    Adnexa: normal adnexa and no mass, fullness, tenderness   Bony Pelvis: average  System: General: well-developed, well-nourished female in no acute distress   Skin: normal coloration and turgor, no rashes   Neurologic: oriented, normal, negative, normal mood   Extremities: normal strength, tone, and muscle mass, ROM of all joints is normal   HEENT PERRLA, extraocular movement intact and sclera clear, anicteric   Mouth/Teeth mucous membranes moist, pharynx normal without lesions and dental hygiene good   Neck supple and no masses   Cardiovascular: regular rate and rhythm   Respiratory:  no respiratory distress, normal breath sounds   Abdomen: soft, non-tender; bowel sounds normal; no masses,  no organomegaly     Assessment:   Pregnancy: [id number]  Patient Active Problem List   Diagnosis Date Noted   AMA (advanced maternal age) multigravida 35+ 03/04/2024   Supervision of high risk pregnancy, antepartum 02/26/2024   IUGR (intrauterine growth restriction) affecting care of mother 09/30/2020   Cesarean delivery delivered 09/30/2020   History of cesarean delivery affecting pregnancy 09/29/2020     Plan:  1. Supervision of high risk pregnancy, antepartum (Primary) New OB labs today - Culture, OB Urine - Hemoglobin A1c - CBC/D/Plt+RPR+Rh+ABO+RubIgG... - PANORAMA PRENATAL TEST - [place]ON Basic Panel - Cytology - PAP( [place])  2. History of cesarean delivery affecting pregnancy X2, with 2 prior SVDs. Desires TOLAC  3. Antepartum multigravida of advanced maternal age NIPT testing  4. H/o FGR  5. 11 weeks.  Initial labs  drawn. Continue prenatal vitamins. Genetic Screening discussed, NIPS: ordered. Ultrasound discussed; fetal anatomic survey: ordered. Problem list reviewed and updated.  Routine obstetric precautions reviewed. Return in 4 weeks (on 04/01/2024).

## 2024-03-05 LAB — CBC/D/PLT+RPR+RH+ABO+RUBIGG...
Antibody Screen: NEGATIVE
Basophils Absolute: 0.1 10*3/uL (ref 0.0–0.2)
Basos: 1 %
EOS (ABSOLUTE): 0.1 10*3/uL (ref 0.0–0.4)
Eos: 1 %
HCV Ab: NONREACTIVE
HIV Screen 4th Generation wRfx: NONREACTIVE
Hematocrit: 34.3 % (ref 34.0–46.6)
Hemoglobin: 12 g/dL (ref 11.1–15.9)
Hepatitis B Surface Ag: NEGATIVE
Immature Grans (Abs): 0 10*3/uL (ref 0.0–0.1)
Immature Granulocytes: 0 %
Lymphocytes Absolute: 1.2 10*3/uL (ref 0.7–3.1)
Lymphs: 16 %
MCH: 31 pg (ref 26.6–33.0)
MCHC: 35 g/dL (ref 31.5–35.7)
MCV: 89 fL (ref 79–97)
Monocytes Absolute: 0.5 10*3/uL (ref 0.1–0.9)
Monocytes: 7 %
Neutrophils Absolute: 5.4 10*3/uL (ref 1.4–7.0)
Neutrophils: 75 %
Platelets: 219 10*3/uL (ref 150–450)
RBC: 3.87 x10E6/uL (ref 3.77–5.28)
RDW: 12.3 % (ref 11.7–15.4)
RPR Ser Ql: NONREACTIVE
Rh Factor: POSITIVE
Rubella Antibodies, IGG: 3.09 {index} (ref >0.99)
WBC: 7.3 10*3/uL (ref 3.4–10.8)

## 2024-03-05 LAB — HEMOGLOBIN A1C
Est. average glucose Bld gHb Est-mCnc: 82 mg/dL
Hgb A1c MFr Bld: 4.5 % — ABNORMAL LOW (ref 4.8–5.6)

## 2024-03-05 LAB — HCV INTERPRETATION

## 2024-03-08 LAB — URINE CULTURE, OB REFLEX

## 2024-03-08 LAB — CULTURE, OB URINE

## 2024-03-09 ENCOUNTER — Ambulatory Visit: Payer: Self-pay | Admitting: Family Medicine

## 2024-03-09 DIAGNOSIS — O0990 Supervision of high risk pregnancy, unspecified, unspecified trimester: Secondary | ICD-10-CM

## 2024-03-09 DIAGNOSIS — R8271 Bacteriuria: Secondary | ICD-10-CM | POA: Insufficient documentation

## 2024-03-09 LAB — CYTOLOGY - PAP
Chlamydia: NEGATIVE
Comment: NEGATIVE
Comment: NEGATIVE
Comment: NORMAL
Diagnosis: NEGATIVE
High risk HPV: NEGATIVE
Neisseria Gonorrhea: NEGATIVE

## 2024-03-09 MED ORDER — CEFADROXIL 500 MG PO CAPS: 500.0000 mg | ORAL_CAPSULE | Freq: Two times a day (BID) | ORAL | 0 refills | Status: AC

## 2024-03-10 LAB — PANORAMA PRENATAL TEST FULL PANEL:PANORAMA TEST PLUS 5 ADDITIONAL MICRODELETIONS: FETAL FRACTION: 3.2

## 2024-03-11 LAB — HORIZON CUSTOM: REPORT SUMMARY: NEGATIVE

## 2024-04-01 ENCOUNTER — Ambulatory Visit: Admitting: Obstetrics and Gynecology

## 2024-04-01 ENCOUNTER — Other Ambulatory Visit: Payer: Self-pay

## 2024-04-01 VITALS — BP 120/74 | HR 92 | Wt 159.0 lb

## 2024-04-01 DIAGNOSIS — O0990 Supervision of high risk pregnancy, unspecified, unspecified trimester: Secondary | ICD-10-CM | POA: Diagnosis not present

## 2024-04-01 NOTE — Progress Notes (Signed)
 Pt left without being seen.

## 2024-04-03 LAB — AFP, SERUM, OPEN SPINA BIFIDA
AFP MoM: 2.99
AFP Value: 83.3 ng/mL
Gest. Age on Collection Date: 15 wk
Maternal Age At EDD: 36.3 a
OSBR Risk 1 IN: 120
Test Results:: POSITIVE — AB
Weight: 159 [lb_av]

## 2024-04-05 ENCOUNTER — Ambulatory Visit: Payer: Self-pay | Admitting: Obstetrics and Gynecology

## 2024-04-05 DIAGNOSIS — O280 Abnormal hematological finding on antenatal screening of mother: Secondary | ICD-10-CM

## 2024-04-06 NOTE — Telephone Encounter (Addendum)
-----   Message from Abigail Abler sent at 04/05/2024  7:45 PM EDT ----- Positive AFP screen noted, will need to inform MFM in case they would like to see the patient sooner  Attempted to contact pt unable to leave voicemail due to no voicemail.  MyChart message sent.   Rees Matura,RN

## 2024-04-19 DIAGNOSIS — O280 Abnormal hematological finding on antenatal screening of mother: Secondary | ICD-10-CM | POA: Insufficient documentation

## 2024-04-19 DIAGNOSIS — R772 Abnormality of alphafetoprotein: Secondary | ICD-10-CM | POA: Insufficient documentation

## 2024-04-29 ENCOUNTER — Ambulatory Visit: Payer: Self-pay | Admitting: Family Medicine

## 2024-04-29 ENCOUNTER — Ambulatory Visit (HOSPITAL_BASED_OUTPATIENT_CLINIC_OR_DEPARTMENT_OTHER): Admitting: Maternal & Fetal Medicine

## 2024-04-29 ENCOUNTER — Other Ambulatory Visit: Payer: Self-pay | Admitting: *Deleted

## 2024-04-29 ENCOUNTER — Other Ambulatory Visit: Payer: Self-pay | Admitting: Family Medicine

## 2024-04-29 ENCOUNTER — Ambulatory Visit

## 2024-04-29 ENCOUNTER — Ambulatory Visit: Attending: Family Medicine

## 2024-04-29 VITALS — BP 96/54 | HR 89

## 2024-04-29 DIAGNOSIS — Z3A19 19 weeks gestation of pregnancy: Secondary | ICD-10-CM | POA: Diagnosis not present

## 2024-04-29 DIAGNOSIS — O35DXX0 Maternal care for other (suspected) fetal abnormality and damage, fetal gastrointestinal anomalies, not applicable or unspecified: Secondary | ICD-10-CM | POA: Diagnosis not present

## 2024-04-29 DIAGNOSIS — R772 Abnormality of alphafetoprotein: Secondary | ICD-10-CM | POA: Diagnosis present

## 2024-04-29 DIAGNOSIS — O34219 Maternal care for unspecified type scar from previous cesarean delivery: Secondary | ICD-10-CM

## 2024-04-29 DIAGNOSIS — Z98891 History of uterine scar from previous surgery: Secondary | ICD-10-CM | POA: Insufficient documentation

## 2024-04-29 DIAGNOSIS — O09522 Supervision of elderly multigravida, second trimester: Secondary | ICD-10-CM

## 2024-04-29 DIAGNOSIS — O4402 Placenta previa specified as without hemorrhage, second trimester: Secondary | ICD-10-CM | POA: Insufficient documentation

## 2024-04-29 DIAGNOSIS — O4442 Low lying placenta NOS or without hemorrhage, second trimester: Secondary | ICD-10-CM | POA: Diagnosis not present

## 2024-04-29 DIAGNOSIS — O0990 Supervision of high risk pregnancy, unspecified, unspecified trimester: Secondary | ICD-10-CM | POA: Diagnosis present

## 2024-04-29 DIAGNOSIS — O43212 Placenta accreta, second trimester: Secondary | ICD-10-CM

## 2024-04-29 DIAGNOSIS — Z87898 Personal history of other specified conditions: Secondary | ICD-10-CM | POA: Insufficient documentation

## 2024-04-29 DIAGNOSIS — O43102 Malformation of placenta, unspecified, second trimester: Secondary | ICD-10-CM | POA: Insufficient documentation

## 2024-04-29 DIAGNOSIS — O280 Abnormal hematological finding on antenatal screening of mother: Secondary | ICD-10-CM

## 2024-04-29 DIAGNOSIS — O359XX0 Maternal care for (suspected) fetal abnormality and damage, unspecified, not applicable or unspecified: Secondary | ICD-10-CM | POA: Insufficient documentation

## 2024-04-29 NOTE — Progress Notes (Signed)
 Patient information  Patient Name: Heidi [name]  Patient MRN:   [id number]  Referring practice: MFM Referring Provider: [facility])  Problem List   Patient Active Problem List   Diagnosis Date Noted   Placental abnormality in second trimester (possible accreta) 04/29/2024   Placenta previa antepartum in second trimester 04/29/2024   Echogenic intra-abdominal focus within the fetal abdomen 04/29/2024   Elevated AFP-2.99, OSBR 1:120 04/19/2024   Asymptomatic bacteriuria during pregnancy 03/09/2024   AMA (advanced maternal age) multigravida 35+ 03/04/2024   Supervision of high risk pregnancy, antepartum 02/26/2024   History of poor fetal growth 09/30/2020   History of 2 cesarean sections 09/29/2020   Maternal Fetal Medicine Consult Heidi [name] is a 38 y.o. [id number] at [redacted]w[redacted]d here for ultrasound and consultation. She had Low risk aneuploidy screening of a female fetus. Carrier screening was Negative for the basic screening (SMA, alpha-thal, beta-thal, and cystic fibroisis. Maternal serum AFP was elevated. She has no acute concerns.   Today we focused on the following:   Echogenic abdominal focus There is an echogenic focus on the left side of the fetal abdomen measuring approximately 4 mm in size.  It is distinct from the stomach and does not appear to be connected to the liver.  I discussed the potential causes including but not limited to calcified cyst, bowel abnormality, sequelae of a genetic or infectious problem.  We discussed the workup may include diagnostic amniocentesis to assess for infectious or genetic abnormalities, cystic fibrosis screening which was done and is negative, continued ultrasound to assess the area during and after birth.  I reassured the patient that typically these are of no clinical significance but may represent a genetic, structural or infectious problem.  While there is limited data the most recent studies suggest that the  majority of these echogenic foci resolve spontaneously or have little to no clinical significance if they persist and genetic and infectious testing is negative.   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  She also has an anterior placenta previa.  Based on these risk factors alone she has approximately a 10 to 15% chance of a placenta accreta.  There is also concern for an area of myometrial thinning as well as loss of the retroplacental clear space near the midline of the bladder. The placental echogenicity also appears mottled in this area with abnormal lacunae. I discussed the clinical concerns as well as the prenatal course for placenta accreta spectrum disorder.  I recommend following up in 5 weeks to further assess the placenta and achieve a more established diagnosis.  If at that time there is concern for placenta accreta spectrum with more certainty then a referral will be made to a tertiary care center to further plan for delivery.  I discussed the concern with placenta accreta spectrum is life-threatening bleeding, need for extensive surgical treatment and delivery as well as potential loss of fertility due to the need for hysterectomy.  The patient verbalized understanding and will follow-up in 5 weeks.  I also discussed the importance of pelvic precautions and abstinence of all sexual activity and anything intravaginally until that time.  Elevated AFP The patient had an unexplained elevated maternal AFP on serum screening with a normal appearing fetal sonogram and low risk NIPS.  She is going to meet with a genetic counselor following today's visit.  This could be due to  the placenta accreta as well.  Sonographic findings Single intrauterine pregnancy at 19w 6d  Fetal cardiac activity:  Observed and appears normal. Presentation: Cephalic. The anatomic structures that were well seen appear  normal except for a 4 x 3.2 x 2.5 mm echogenic area in the fetal abdomen anterior to the stomach.  This likely represents idiopathic intra-abdominal calcification but may be a sign of infectious, genetic or intestinal pathology. Due to poor acoustic windows some structures remain suboptimally visualized. Fetal biometry shows the estimated fetal weight at the 40 percentile.  Amniotic fluid: Within normal limits.  MVP: 4.97 cm. Placenta: Anterior previa, suspect accreta due to  an area of myometrial thinning as well as loss of the retroplacental clear space near the midline of the bladder.  The placental echogenicity also appears mottled in this area with abnormal lacunae. Adnexa: No adnexal mass visualized. Cervical length: 3 cm.  There are limitations of prenatal ultrasound such as the inability to detect certain abnormalities due to poor visualization. Various factors such as fetal position, gestational age and maternal body habitus may increase the difficulty in visualizing the fetal anatomy.    Recommendations -EDD should be 09/17/2024 based on  LMP  (12/12/23).  -Amniocentesis (for karyotype with chromosomal microarray reflex, CMV PCR and toxoplasmosis) was offered but declined. -Maternal serum TORCH studies (CMV, toxo, Parvo B19, Herpes) were offered by declined.  -Follow-up ultrasound for growth and reassessment of the fetal bowel and the placenta is recommended in 5 weeks from now. -Based on the future ultrasound findings we will decide if she needs to be referred to a tertiary care center due to the presence of a placenta accreta -Postnatal evaluation will be done by the pediatrician with imaging and labs as necessary for the fetal intestinal echogenic foci -Pelvic precautions given-recommend avoiding anything intravaginally until further clarification can be achieved regarding a potential focal accreta  Review of Systems: A review of systems was performed and was negative except per HPI    Past Obstetrical History:  OB History  Gravida Para Term Preterm AB Living  5 4 3 1  0 4  SAB IAB Ectopic Multiple Live Births  0 0 0 0 4    # Outcome Date GA Lbr Len/2nd Weight Sex Type Anes PTL Lv  5 Current           4 Preterm 09/30/20 [redacted]w[redacted]d  4 lb 8.3 oz (2.05 kg) F CS-LTranv Spinal  LIV     Birth Comments: Late preterm, SGA female infant  3 Term 01/22/15   7 lb 4 oz (3.289 kg) M CS-Unspec   LIV     Complications: Breech presentation  2 Term 07/10/12   7 lb 4 oz (3.289 kg) M Vag-Spont EPI  LIV  1 Term 01/05/07   6 lb 2 oz (2.778 kg) M Vag-Spont EPI  LIV     Past Medical History:  Past Medical History:  Diagnosis Date   Medical history non-contributory      Past Surgical History:    Past Surgical History:  Procedure Laterality Date   CESAREAN SECTION     CESAREAN SECTION N/A 09/30/2020   Procedure: CESAREAN SECTION;  Surgeon: Lorence Ozell CROME, MD;  Location: [facility] LD ORS;  Service: Obstetrics;  Laterality: N/A;     Home Medications:   Current Outpatient Medications on File Prior to Visit  Medication Sig Dispense Refill   Prenatal Vit-Fe Fumarate-FA (PRENATAL VITAMIN PO) Take 1 tablet by mouth daily.  (Patient not taking: Reported on 03/04/2024)  No current facility-administered medications on file prior to visit.      Allergies:   No Known Allergies   Physical Exam:   Vitals:   04/29/24 0908  BP: (!) 96/54  Pulse: 89   Sitting comfortably on the sonogram table Nonlabored breathing Normal rate and rhythm Abdomen is nontender  Thank you for the opportunity to be involved with this patient's care. Please let us  know if we can be of any further assistance.   60 minutes of time was spent reviewing the patient's chart including labs, imaging and documentation.  At least 50% of this time was spent with direct patient care discussing the diagnosis, management and prognosis of her care.  Bascom Nasuti MFM, [place]   04/29/2024  11:39 AM

## 2024-04-29 NOTE — Progress Notes (Addendum)
 [facility] at [facility] [address] Phone:  [phone]   Fax:  [phone]      In-Person Genetic Counseling Clinic Note:   I spoke with 38 y.o. Heidi [name] today to discuss her elevated msAFP results. She was referred by Heidi Jerilynn LABOR, MD. She was accompanied by FOB Heidi [name].   Pregnancy History:    [id number]. EGA: [redacted]w[redacted]d by LMP. EDD: 09/17/2024. Denies personal history of diabetes, high blood pressure, thyroid conditions, and seizures. Denies bleeding, infections, and fevers in this pregnancy. Denies using tobacco, alcohol, or street drugs in this pregnancy.   Family History:    A three-generation pedigree was created and scanned into Epic under the Media tab.  The couple reports their 36 yo daughter has a history of febrile seizures with the first starting at 11 mo. She has had several episodes that mainly occur if she is not on schedule with her Keppra medication. She has been evaluated at [facility]. The couple report they did not find a cause; however, they report genetic testing returned with a positive result that was maternally inherited. They said that this result was not associated with other features. Patient and family history is negative for epilepsy. We discussed that in the event of a genetic condition, recurrence risk may be up to 50%. Without a known genetic etiology to seizures, recurrence risk would be around 6%. The couple is encouraged to share these results to further assess recurrence risk.  Maternal ethnicity reported as White and paternal ethnicity reported as Hispanic/Black. Denies Ashkenazi Jewish ancestry.  Family history not remarkable for consanguinity, individuals with birth defects, intellectual disability, autism spectrum disorder, multiple spontaneous abortions, still births, or unexplained neonatal death.   Elevated msAFP:   Maternal Serum AFP (msAFP) is a maternal blood test that measures  maternal serum AFP levels to determine if a pregnancy is at higher risk for certain birth defects; however, it cannot diagnose or rule out these conditions.  msAFP screening can identify approximately 80% of open neural tube defects (ONTDs). Increased msAFP levels can be associated with neural tube defects such as spina bifida and anencephaly, abdominal wall defects, certain genetic conditions, inaccurate pregnancy dating, presence of twins, pregnancy complications, and normal variation.  Heidi [name] had a msAFP level of 2.99 MoMs incomparison to the cutoff value of 2.5 MoMs, indicating elevated AFP in her bloodstream. Based on this result, the risk for an open neural tube defect is 1 in 120.  We reviewed available options including anatomy ultrasounds and amniocentesis as well as their benefits, risks, and limitations. We reviewed that the anatomy ultrasound can detect most types of neural tube defects. Heidi [name] had an anatomy ultrasound performed today that did not show signs of spina bifida.  Moreover, unexplained elevations of msAFP have been associated with adverse pregnancy outcomes such as fetal growth restriction, preterm [name], pre-eclampsia, or fetal demise. For this reason, growth ultrasounds are typically recommended.  We discussed that Heidi [name] has the option of continuing to monitor the pregnancy via routine ultrasounds with the understanding that a normal ultrasound does not guarantee a healthy pregnancy.  Advanced Maternal Age:  We briefly discussed that the chance that a fetus would be affected with a chromosome difference increases with advanced maternal age. The chance that Heidi [name]'s current pregnancy would be affected with a chromosome difference based on her age alone is approximately 1 in 149 (~0.7%). This means that there is a 148 in 149 (~99.3%) chance  that the fetus would not be affected with a chromosome difference. We discussed that this risk may also be lower given her low-risk NIPS  results.  We discussed and offered the option of amniocentesis. The technical aspects, benefits, risks, and limitations were reviewed with the patient including the 1 in 500 risk for miscarriage. We also discussed that testing can be performed postnatally if there is concern for a chromosomal condition in the newborn. Heidi [name] declined amniocentesis.    Newborn Screening. The [place]  Newborn Screening (NBS) program will screen all newborn babies for cystic fibrosis, spinal muscular atrophy, hemoglobinopathies, and numerous other conditions.  Previous Testing Completed:  Low risk NIPS: Heidi [name] previously completed noninvasive prenatal screening (NIPS) in this pregnancy. The result is low risk, consistent with a female fetus. This screening significantly reduces but does not eliminate the chance that the current pregnancy has Down syndrome (trisomy 36), trisomy 63, trisomy 75, and common sex chromosome conditions. Please see report for details. There are many genetic conditions that cannot be detected by NIPS.   Negative carrier screening: Heidi [name] previously completed carrier screening. She screened to not be a carrier for cystic fibrosis (CF), spinal muscular atrophy (SMA), alpha thalassemia, and beta hemoglobinopathies. Please see report for details. A negative result on carrier screening reduces but does not eliminate the chance of being a carrier.    Plan of Care:   Patient declined amniocentesis. Follow-up ultrasound at [facility] has been scheduled.  Informed consent was obtained. All questions were answered.   50 minutes were spent on the date of the encounter in service to the patient including preparation, face-to-face consultation, discussion of test reports and available next steps, pedigree construction, genetic risk assessment, documentation, and care coordination.    Thank you for sharing in the care of Heidi [name] with us .  Please do not hesitate to contact us  at [phone] if you have any  questions.   Heidi Bodily, MS, [facility] Certified Genetic Counselor   Genetic counseling student involved in appointment:   I provided general supervision for this patient and was immediately available for any patient care concerns.

## 2024-05-03 ENCOUNTER — Other Ambulatory Visit: Payer: Self-pay

## 2024-05-03 ENCOUNTER — Ambulatory Visit: Admitting: Obstetrics and Gynecology

## 2024-05-03 VITALS — BP 108/72 | HR 102 | Wt 158.7 lb

## 2024-05-03 DIAGNOSIS — O09522 Supervision of elderly multigravida, second trimester: Secondary | ICD-10-CM | POA: Diagnosis not present

## 2024-05-03 DIAGNOSIS — Z3A20 20 weeks gestation of pregnancy: Secondary | ICD-10-CM

## 2024-05-03 DIAGNOSIS — O4402 Placenta previa specified as without hemorrhage, second trimester: Secondary | ICD-10-CM | POA: Diagnosis not present

## 2024-05-03 DIAGNOSIS — O0990 Supervision of high risk pregnancy, unspecified, unspecified trimester: Secondary | ICD-10-CM

## 2024-05-03 DIAGNOSIS — O359XX0 Maternal care for (suspected) fetal abnormality and damage, unspecified, not applicable or unspecified: Secondary | ICD-10-CM

## 2024-05-03 DIAGNOSIS — O280 Abnormal hematological finding on antenatal screening of mother: Secondary | ICD-10-CM

## 2024-05-03 DIAGNOSIS — Z98891 History of uterine scar from previous surgery: Secondary | ICD-10-CM

## 2024-05-03 DIAGNOSIS — O43102 Malformation of placenta, unspecified, second trimester: Secondary | ICD-10-CM

## 2024-05-03 DIAGNOSIS — O34219 Maternal care for unspecified type scar from previous cesarean delivery: Secondary | ICD-10-CM

## 2024-05-03 NOTE — Progress Notes (Signed)
   PRENATAL VISIT NOTE  Subjective:  Heidi [name] is a 38 y.o. H4E6895 at [redacted]w[redacted]d being seen today for ongoing prenatal care.  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  Patient doing well with no acute concerns today. She reports no complaints.   . Vag. Bleeding: None.  Movement: Present. Denies leaking of fluid.   The following portions of the patient's history were reviewed and updated as appropriate: allergies, current medications, past family history, past medical history, past social history, past surgical history and problem list. Problem list updated.  Objective:   Vitals:   05/03/24 1623  BP: 108/72  Pulse: (!) 102  Weight: 158 lb 11.2 oz (72 kg)    Fetal Status: Fetal Heart Rate (bpm): 161   Movement: Present     General:  Alert, oriented and cooperative. Patient is in no acute distress.  Skin: Skin is warm and dry. No rash noted.   Cardiovascular: Normal heart rate noted  Respiratory: Normal respiratory effort, no problems with respiration noted  Abdomen: Soft, gravid, appropriate for gestational age.  Pain/Pressure: Absent     Pelvic: Cervical exam deferred        Extremities: Normal range of motion.     Mental Status:  Normal mood and affect. Normal behavior. Normal judgment and thought content.   Assessment and Plan:  Pregnancy: H4E6895 at [redacted]w[redacted]d  1. [redacted] weeks gestation of pregnancy (Primary)   2. Supervision of high risk pregnancy, antepartum Continue routine prenatal care  3. Placenta previa  antepartum in second trimester Increased risk for placenta accreta with previa and anterior position  4. History of 2 cesarean sections   5. Known fetal anomaly, antepartum, single or unspecified fetus Pt has follow up scan on 06/03/24  6. Multigravida of advanced maternal age in second trimester   7. Elevated msAFP 2.99 MoM, 1/120 OSB risk Further eval needed, may be attributed to abnl placenta  8. 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  Please refer to After Visit Summary for other counseling recommendations.   Return in about 4 weeks (around 05/31/2024) for [facility], in person.   Jerilynn Buddle, MD Faculty Attending Center for [facility]

## 2024-05-31 ENCOUNTER — Ambulatory Visit: Admitting: Family Medicine

## 2024-05-31 ENCOUNTER — Other Ambulatory Visit: Payer: Self-pay

## 2024-05-31 VITALS — BP 114/71 | HR 85 | Wt 157.8 lb

## 2024-05-31 DIAGNOSIS — O0990 Supervision of high risk pregnancy, unspecified, unspecified trimester: Secondary | ICD-10-CM

## 2024-05-31 DIAGNOSIS — Z98891 History of uterine scar from previous surgery: Secondary | ICD-10-CM

## 2024-05-31 DIAGNOSIS — O359XX0 Maternal care for (suspected) fetal abnormality and damage, unspecified, not applicable or unspecified: Secondary | ICD-10-CM | POA: Diagnosis not present

## 2024-05-31 DIAGNOSIS — O09522 Supervision of elderly multigravida, second trimester: Secondary | ICD-10-CM

## 2024-05-31 DIAGNOSIS — O4402 Placenta previa specified as without hemorrhage, second trimester: Secondary | ICD-10-CM | POA: Diagnosis not present

## 2024-05-31 DIAGNOSIS — O0992 Supervision of high risk pregnancy, unspecified, second trimester: Secondary | ICD-10-CM | POA: Diagnosis not present

## 2024-05-31 DIAGNOSIS — O43102 Malformation of placenta, unspecified, second trimester: Secondary | ICD-10-CM

## 2024-05-31 DIAGNOSIS — O280 Abnormal hematological finding on antenatal screening of mother: Secondary | ICD-10-CM

## 2024-05-31 DIAGNOSIS — Z3A24 24 weeks gestation of pregnancy: Secondary | ICD-10-CM

## 2024-05-31 NOTE — Progress Notes (Signed)
   PRENATAL VISIT NOTE  Subjective:  Heidi [name] is a 38 y.o. H4E6895 at [redacted]w[redacted]d being seen today for ongoing prenatal care.  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  Patient reports no bleeding, no contractions, no cramping, and no leaking.  Contractions: Not present. Vag. Bleeding: None.  Movement: Present. Denies leaking of fluid.   The following portions of the patient's history were reviewed and updated as appropriate: allergies, current medications, past family history, past medical history, past social history, past surgical history and problem list.   Objective:    Vitals:   05/31/24 1532  BP: 114/71  Pulse: 85  Weight: 157 lb 12.8 oz (71.6 kg)    Fetal Status:  Fetal Heart Rate (bpm): 147   Movement: Present    General: Alert, oriented and cooperative. Patient is in no acute distress.  Skin: Skin is warm and dry. No rash noted.   Cardiovascular: Normal heart rate noted  Respiratory: Normal respiratory effort, no problems with respiration noted  Abdomen: Soft, gravid, appropriate for gestational age.  Pain/Pressure: Present     Pelvic: Cervical exam deferred        Extremities: Normal range of motion.  Edema: None  Mental Status: Normal mood and affect. Normal behavior. Normal judgment and thought content.   Assessment and Plan:  Pregnancy: H4E6895 at [redacted]w[redacted]d 1. Supervision of high risk pregnancy, antepartum (Primary) Continue routine prenatal care 2 hour GTT next visit.   2. Placenta previa  antepartum in second trimester Anterior previa on US , concerning for accreta. Established with MFM.   3. History of 2 cesarean sections   4. Known fetal anomaly, antepartum, single or unspecified fetus Genetic counseling   5. Multigravida of advanced maternal age in second trimester   6. Elevated msAFP 2.99 MoM, 1/120 OSB risk Referred for genetic counseling. Declined amniocentesis.   7. 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 Please refer to After Visit Summary for other counseling recommendations.   No follow-ups on file.  Future Appointments  Date Time Provider Department Center  06/03/2024  2:45 PM [facility] PROVIDER 1 [facility]  06/03/2024  3:00 PM [facility] US5 [facility]    Heidi [name] LITTIE Angles, MD

## 2024-06-03 ENCOUNTER — Ambulatory Visit

## 2024-06-03 DIAGNOSIS — O0990 Supervision of high risk pregnancy, unspecified, unspecified trimester: Secondary | ICD-10-CM

## 2024-06-08 ENCOUNTER — Ambulatory Visit: Attending: Maternal & Fetal Medicine | Admitting: Maternal & Fetal Medicine

## 2024-06-08 ENCOUNTER — Other Ambulatory Visit: Payer: Self-pay | Admitting: *Deleted

## 2024-06-08 ENCOUNTER — Ambulatory Visit

## 2024-06-08 VITALS — BP 106/58

## 2024-06-08 DIAGNOSIS — O43212 Placenta accreta, second trimester: Secondary | ICD-10-CM | POA: Diagnosis not present

## 2024-06-08 DIAGNOSIS — O35BXX0 Maternal care for other (suspected) fetal abnormality and damage, fetal cardiac anomalies, not applicable or unspecified: Secondary | ICD-10-CM

## 2024-06-08 DIAGNOSIS — O09292 Supervision of pregnancy with other poor reproductive or obstetric history, second trimester: Secondary | ICD-10-CM | POA: Diagnosis not present

## 2024-06-08 DIAGNOSIS — O09212 Supervision of pregnancy with history of pre-term labor, second trimester: Secondary | ICD-10-CM | POA: Insufficient documentation

## 2024-06-08 DIAGNOSIS — Z3A25 25 weeks gestation of pregnancy: Secondary | ICD-10-CM

## 2024-06-08 DIAGNOSIS — O09522 Supervision of elderly multigravida, second trimester: Secondary | ICD-10-CM | POA: Insufficient documentation

## 2024-06-08 DIAGNOSIS — O43102 Malformation of placenta, unspecified, second trimester: Secondary | ICD-10-CM

## 2024-06-08 DIAGNOSIS — O34211 Maternal care for low transverse scar from previous cesarean delivery: Secondary | ICD-10-CM | POA: Diagnosis not present

## 2024-06-08 DIAGNOSIS — O4402 Placenta previa specified as without hemorrhage, second trimester: Secondary | ICD-10-CM | POA: Diagnosis present

## 2024-06-08 DIAGNOSIS — Z98891 History of uterine scar from previous surgery: Secondary | ICD-10-CM | POA: Diagnosis not present

## 2024-06-08 DIAGNOSIS — O34219 Maternal care for unspecified type scar from previous cesarean delivery: Secondary | ICD-10-CM

## 2024-06-08 DIAGNOSIS — O289 Unspecified abnormal findings on antenatal screening of mother: Secondary | ICD-10-CM

## 2024-06-08 DIAGNOSIS — O283 Abnormal ultrasonic finding on antenatal screening of mother: Secondary | ICD-10-CM

## 2024-06-08 DIAGNOSIS — Z363 Encounter for antenatal screening for malformations: Secondary | ICD-10-CM | POA: Insufficient documentation

## 2024-06-08 DIAGNOSIS — O0990 Supervision of high risk pregnancy, unspecified, unspecified trimester: Secondary | ICD-10-CM

## 2024-06-08 DIAGNOSIS — O358XX0 Maternal care for other (suspected) fetal abnormality and damage, not applicable or unspecified: Secondary | ICD-10-CM | POA: Diagnosis not present

## 2024-06-08 NOTE — Progress Notes (Signed)
 Patient information  Patient Name: Heidi [name]  Patient MRN:   [id number]  Referring practice: MFM Referring Provider: [facility])  Problem List   Patient Active Problem List   Diagnosis Date Noted   Placental abnormality in second trimester (possible accreta) 04/29/2024   Placenta previa antepartum in second trimester 04/29/2024   Echogenic intra-abdominal focus within the fetal abdomen 04/29/2024   Elevated msAFP 2.99 MoM, 1/120 OSB risk 04/19/2024   Asymptomatic bacteriuria during pregnancy 03/09/2024   AMA (advanced maternal age) multigravida 35+ 03/04/2024   Supervision of high risk pregnancy, antepartum 02/26/2024   History of poor fetal growth 09/30/2020   History of 2 cesarean sections 09/29/2020    Maternal Fetal medicine Consult  Heidi [name] is a 38 y.o. H4E6895 at [redacted]w[redacted]d here for ultrasound and consultation. Peter Minehart is doing well today with no acute concerns. 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  There was also concern for placenta accreta based on the presence of a previa with a previous cesarean delivery.  The risk is now gone from approximately 10 to 15% down to less than 1% based on the absence of the placenta previa.  However, there are a number of abnormal looking lacunae and some myometrial thinning which likely puts the risk up and towards the 1 to 2% chance.  There are no bridging vessels.  I discussed there is still concern for placenta accreta but this should be assessed on future ultrasounds.  If at 28 weeks there is still concern for placenta accreta then a referral will be made to a tertiary care center with possible MRI.  The patient denies vaginal bleeding or cramping.  I gave her pelvic precautions and to monitor for bleeding and to go straight to the nearest hospital with any concerns.  The patient had time to ask  questions that were answered to her satisfaction.  She verbalized understanding and agrees to proceed with the plan below.   Sonographic findings Single intrauterine pregnancy at 25w 4d.  Fetal cardiac activity:  Observed and appears normal. Presentation: Cephalic. Interval fetal anatomy appears normal. Fetal biometry shows the estimated fetal weight at the 18 percentile. Amniotic fluid volume: Within normal limits. MVP: 4.97 cm. Placenta: Anterior. The placenta has a number of abnormal lacunae but no bridging vessels.  There is some concern for placental invasion to the cervix but this is difficult to discern.  The bladder placental interface does not have any abnormal bridging vessels.  There are limitations of prenatal ultrasound such as the inability to detect certain abnormalities due to poor visualization. Various factors such as fetal position, gestational age and maternal body habitus may increase the difficulty in visualizing the fetal anatomy.    Recommendations - Transvaginal ultrasound with fetal biometry at 28 weeks.  If there is still concern for placenta accreta a referral will be made to a tertiary care center for possible MRI.  Review of Systems: A review of systems was performed and was negative except per HPI   Vitals and Physical Exam    06/08/2024    9:20 AM 05/31/2024    3:32 PM 05/03/2024    4:23 PM  Vitals with BMI  Weight  157 lbs 13 oz 158 lbs 11 oz  Systolic 106 114 891  Diastolic 58 71 72  Pulse  85 102    Sitting comfortably on the sonogram  table Nonlabored breathing Normal rate and rhythm Abdomen is nontender  Past pregnancies OB History  Gravida Para Term Preterm AB Living  5 4 3 1  0 4  SAB IAB Ectopic Multiple Live Births  0 0 0 0 4    # Outcome Date GA Lbr Len/2nd Weight Sex Type Anes PTL Lv  5 Current           4 Preterm 09/30/20 [redacted]w[redacted]d  4 lb 8.3 oz (2.05 kg) F CS-LTranv Spinal  LIV     Birth Comments: Late preterm, SGA female infant  3 Term  01/22/15   7 lb 4 oz (3.289 kg) M CS-Unspec   LIV     Complications: Breech presentation  2 Term 07/10/12   7 lb 4 oz (3.289 kg) M Vag-Spont EPI  LIV  1 Term 01/05/07   6 lb 2 oz (2.778 kg) M Vag-Spont EPI  LIV     I spent 30 minutes reviewing the patients chart, including labs and images as well as counseling the patient about her medical conditions. Greater than 50% of the time was spent in direct face-to-face patient counseling.  Delora Smaller  MFM, [facility]   06/08/2024  10:26 AM

## 2024-06-30 ENCOUNTER — Other Ambulatory Visit: Payer: Self-pay

## 2024-06-30 ENCOUNTER — Ambulatory Visit (INDEPENDENT_AMBULATORY_CARE_PROVIDER_SITE_OTHER): Admitting: Obstetrics and Gynecology

## 2024-06-30 ENCOUNTER — Other Ambulatory Visit

## 2024-06-30 VITALS — BP 100/61 | HR 93 | Wt 156.9 lb

## 2024-06-30 DIAGNOSIS — O0990 Supervision of high risk pregnancy, unspecified, unspecified trimester: Secondary | ICD-10-CM

## 2024-06-30 DIAGNOSIS — Z3A28 28 weeks gestation of pregnancy: Secondary | ICD-10-CM | POA: Diagnosis not present

## 2024-06-30 DIAGNOSIS — O0993 Supervision of high risk pregnancy, unspecified, third trimester: Secondary | ICD-10-CM | POA: Diagnosis not present

## 2024-06-30 DIAGNOSIS — O43103 Malformation of placenta, unspecified, third trimester: Secondary | ICD-10-CM | POA: Diagnosis not present

## 2024-06-30 DIAGNOSIS — Z23 Encounter for immunization: Secondary | ICD-10-CM

## 2024-06-30 DIAGNOSIS — Z98891 History of uterine scar from previous surgery: Secondary | ICD-10-CM

## 2024-06-30 DIAGNOSIS — O4403 Placenta previa specified as without hemorrhage, third trimester: Secondary | ICD-10-CM | POA: Diagnosis not present

## 2024-06-30 DIAGNOSIS — O4402 Placenta previa specified as without hemorrhage, second trimester: Secondary | ICD-10-CM

## 2024-06-30 DIAGNOSIS — O359XX0 Maternal care for (suspected) fetal abnormality and damage, unspecified, not applicable or unspecified: Secondary | ICD-10-CM

## 2024-06-30 DIAGNOSIS — Z7689 Persons encountering health services in other specified circumstances: Secondary | ICD-10-CM

## 2024-06-30 DIAGNOSIS — O280 Abnormal hematological finding on antenatal screening of mother: Secondary | ICD-10-CM

## 2024-06-30 DIAGNOSIS — O09523 Supervision of elderly multigravida, third trimester: Secondary | ICD-10-CM

## 2024-06-30 DIAGNOSIS — Z87898 Personal history of other specified conditions: Secondary | ICD-10-CM

## 2024-06-30 DIAGNOSIS — O43102 Malformation of placenta, unspecified, second trimester: Secondary | ICD-10-CM

## 2024-06-30 NOTE — Progress Notes (Signed)
   PRENATAL VISIT NOTE  Subjective:  Heidi [name] is a 38 y.o. H4E6895 at [redacted]w[redacted]d being seen today for ongoing prenatal care.  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  Patient doing well with no acute concerns today. She reports no complaints.   . Vag. Bleeding: None.  Movement: Present. Denies leaking of fluid.   The following portions of the patient's history were reviewed and updated as appropriate: allergies, current medications, past family history, past medical history, past social history, past surgical history and problem list. Problem list updated.  Objective:   Vitals:   06/30/24 1015  BP: 100/61  Pulse: 93  Weight: 156 lb 14.4 oz (71.2 kg)    Fetal Status: Fetal Heart Rate (bpm): 149 Fundal Height: 29 cm Movement: Present     General:  Alert, oriented and cooperative. Patient is in no acute distress.  Skin: Skin is warm and dry. No rash noted.   Cardiovascular: Normal heart rate noted  Respiratory: Normal respiratory effort, no problems with respiration noted  Abdomen: Soft, gravid, appropriate for gestational age.  Pain/Pressure: Absent     Pelvic: Cervical exam deferred        Extremities: Normal range of motion.     Mental Status:  Normal mood and affect. Normal behavior. Normal judgment and thought content.   Assessment and Plan:  Pregnancy: H4E6895 at [redacted]w[redacted]d  1. Supervision of high risk pregnancy, antepartum (Primary) Continue routine  prenatal care  - Tdap vaccine greater than or equal to 7yo IM - Flu vaccine trivalent PF, 6mos and older(Flulaval,Afluria,Fluarix,Fluzone)  2. [redacted] weeks gestation of pregnancy   3. 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 Placenta previa antepartum in second trimester (RESOLVED) Resolved as of last ultrasound  5. History of poor fetal growth Growth scan 9/5 Last EFW was 18%  6. History of 2 cesarean sections   7. Elevated msAFP 2.99 MoM, 1/120 OSB risk   8. Known fetal anomaly, antepartum, single or unspecified fetus   9. Multigravida of advanced maternal age in third trimester   38. Poor dentition requiring referral to dentistry Will refer to dental clinic due to possible caries and abnormal dentition  Preterm labor symptoms and general obstetric precautions including but not limited to vaginal bleeding, contractions, leaking of fluid and fetal movement were reviewed in detail with the patient.  Please refer to After Visit Summary for other counseling recommendations.   Return in about 2 weeks (around 07/14/2024) for [facility], in person.   Jerilynn Buddle, MD Faculty Attending Center for [facility]

## 2024-07-01 ENCOUNTER — Ambulatory Visit: Payer: Self-pay | Admitting: Obstetrics and Gynecology

## 2024-07-01 LAB — CBC
Hematocrit: 32.7 % — ABNORMAL LOW (ref 34.0–46.6)
Hemoglobin: 10.9 g/dL — ABNORMAL LOW (ref 11.1–15.9)
MCH: 31.5 pg (ref 26.6–33.0)
MCHC: 33.3 g/dL (ref 31.5–35.7)
MCV: 95 fL (ref 79–97)
Platelets: 225 x10E3/uL (ref 150–450)
RBC: 3.46 x10E6/uL — ABNORMAL LOW (ref 3.77–5.28)
RDW: 12.7 % (ref 11.7–15.4)
WBC: 8.4 x10E3/uL (ref 3.4–10.8)

## 2024-07-01 LAB — GLUCOSE TOLERANCE, 2 HOURS W/ 1HR
Glucose, 1 hour: 136 mg/dL (ref 70–179)
Glucose, 2 hour: 97 mg/dL (ref 70–152)
Glucose, Fasting: 92 mg/dL — ABNORMAL HIGH (ref 70–91)

## 2024-07-01 LAB — HIV ANTIBODY (ROUTINE TESTING W REFLEX): HIV Screen 4th Generation wRfx: NONREACTIVE

## 2024-07-01 LAB — RPR: RPR Ser Ql: NONREACTIVE

## 2024-07-02 ENCOUNTER — Ambulatory Visit (HOSPITAL_BASED_OUTPATIENT_CLINIC_OR_DEPARTMENT_OTHER)

## 2024-07-02 ENCOUNTER — Other Ambulatory Visit: Payer: Self-pay | Admitting: *Deleted

## 2024-07-02 ENCOUNTER — Ambulatory Visit: Attending: Maternal & Fetal Medicine | Admitting: Maternal & Fetal Medicine

## 2024-07-02 VITALS — BP 106/71 | HR 101

## 2024-07-02 DIAGNOSIS — O09523 Supervision of elderly multigravida, third trimester: Secondary | ICD-10-CM

## 2024-07-02 DIAGNOSIS — O0990 Supervision of high risk pregnancy, unspecified, unspecified trimester: Secondary | ICD-10-CM

## 2024-07-02 DIAGNOSIS — O09213 Supervision of pregnancy with history of pre-term labor, third trimester: Secondary | ICD-10-CM | POA: Insufficient documentation

## 2024-07-02 DIAGNOSIS — O4402 Placenta previa specified as without hemorrhage, second trimester: Secondary | ICD-10-CM | POA: Diagnosis present

## 2024-07-02 DIAGNOSIS — O34211 Maternal care for low transverse scar from previous cesarean delivery: Secondary | ICD-10-CM | POA: Insufficient documentation

## 2024-07-02 DIAGNOSIS — O24410 Gestational diabetes mellitus in pregnancy, diet controlled: Secondary | ICD-10-CM

## 2024-07-02 DIAGNOSIS — Z362 Encounter for other antenatal screening follow-up: Secondary | ICD-10-CM | POA: Diagnosis not present

## 2024-07-02 DIAGNOSIS — O43102 Malformation of placenta, unspecified, second trimester: Secondary | ICD-10-CM

## 2024-07-02 DIAGNOSIS — O35BXX0 Maternal care for other (suspected) fetal abnormality and damage, fetal cardiac anomalies, not applicable or unspecified: Secondary | ICD-10-CM | POA: Diagnosis not present

## 2024-07-02 DIAGNOSIS — Z3A29 29 weeks gestation of pregnancy: Secondary | ICD-10-CM

## 2024-07-02 DIAGNOSIS — Z98891 History of uterine scar from previous surgery: Secondary | ICD-10-CM | POA: Diagnosis not present

## 2024-07-02 DIAGNOSIS — O43213 Placenta accreta, third trimester: Secondary | ICD-10-CM | POA: Insufficient documentation

## 2024-07-02 DIAGNOSIS — O289 Unspecified abnormal findings on antenatal screening of mother: Secondary | ICD-10-CM | POA: Insufficient documentation

## 2024-07-02 DIAGNOSIS — O09293 Supervision of pregnancy with other poor reproductive or obstetric history, third trimester: Secondary | ICD-10-CM | POA: Diagnosis not present

## 2024-07-02 DIAGNOSIS — O4403 Placenta previa specified as without hemorrhage, third trimester: Secondary | ICD-10-CM | POA: Diagnosis not present

## 2024-07-02 DIAGNOSIS — O283 Abnormal ultrasonic finding on antenatal screening of mother: Secondary | ICD-10-CM | POA: Diagnosis not present

## 2024-07-02 DIAGNOSIS — O09522 Supervision of elderly multigravida, second trimester: Secondary | ICD-10-CM | POA: Diagnosis present

## 2024-07-02 DIAGNOSIS — O358XX0 Maternal care for other (suspected) fetal abnormality and damage, not applicable or unspecified: Secondary | ICD-10-CM | POA: Insufficient documentation

## 2024-07-02 DIAGNOSIS — O359XX0 Maternal care for (suspected) fetal abnormality and damage, unspecified, not applicable or unspecified: Secondary | ICD-10-CM

## 2024-07-02 NOTE — Progress Notes (Signed)
 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 H4E6895 at [redacted]w[redacted]d here for ultrasound and consultation. Heidi [name] is doing well today with no acute concerns. Today we focused on the following:   On previous ultrasounds there was concern for possible placenta accreta.  I counseled the patient regarding the clinical significance and severity of this condition.  On today's ultrasound there are still findings that are suggestive of placenta accreta including retroplacental thinning, a few bridging vessels, numerous abnormally shaped lacunae and a placenta previa.  I have suspicion that this would be a focal accreta based on the minimal extent of these findings.  The a priori risk given her history of 2 cesarean deliveries as well as the placenta previa. Based on these risk factors alone she has approximately a 10 to 15% chance of a placenta accreta.  The anterior edge of the placenta barely touches the internal os but it does appear to reach that area.  I discussed the management of placenta accreta involves extensive surgical assistance, anesthesia at a center that  regularly performs this procedure.  I discussed her options of going to either [place], [facility] or [facility] and the patient elected to be sent to [facility] for further evaluation and possible delivery at that location when the time is needed.  The patient and her significant other voiced that this would not be their desire to deliver outside of [facility] and may have difficulty with transportation but they will prioritize this visit.  The patient recently failed her gestational diabetes screening.  She drank a large quantity of sweet tea prior to her GTT and would like to repeat it.  There continues to be an echogenic abdominal focus.  It is distinct from the stomach and does not appear to be connected to the liver.  I discussed the potential causes including but not limited to calcified cyst, bowel abnormality, sequelae of a genetic or infectious problem.  I previously counseled the patient regarding the significance of this as well as the need for either amniocentesis or infectious serology testing but the patient has declined.  The fetal head circumference is between 1 and 2 standard deviations.  This likely represents normal constitutionally small growth.  If the fetal head size drops below 2-3 standard deviations then this may represent pathologic microcephaly and the pediatrician should be notified.  The patient had time to ask questions that were answered to her satisfaction.  She verbalized understanding and agrees to proceed with the plan below.  Sonographic findings Single intrauterine pregnancy at 29w 0d.  Fetal cardiac activity:  Observed and appears normal. Presentation: Cephalic. The anatomic structures that were well seen appear normal except for a 4 x 3.2  x 2.5 mm echogenic area in the fetal abdomen anterior to the stomach.  This likely represents idiopathic intra-abdominal calcification but may be a sign of infectious, genetic or intestinal pathology.  The remainder of the fetal anatomy  appears normal. Fetal biometry shows the estimated fetal weight at the 16 percentile. Amniotic fluid volume: Within normal limits. MVP: 5.42 cm. Placenta: Anterior previa, suspect accreta due to  an area of myometrial thinning as well as loss of the retroplacental clear space near the midline of the bladder.  The placental echogenicity also appears mottled in this area with abnormal lacunae.   There are limitations of prenatal ultrasound such as the inability to detect certain abnormalities due to poor visualization. Various factors such as fetal position, gestational age and maternal body habitus may increase the difficulty in visualizing the fetal anatomy.    Recommendations - Follow-up growth ultrasound in 3 weeks - Referral to [facility] MFM due to concern for placenta accreta  Review of Systems: A review of systems was performed and was negative except per HPI   Vitals and Physical Exam    07/02/2024    2:21 PM 06/30/2024   10:15 AM 06/08/2024    9:20 AM  Vitals with BMI  Weight  156 lbs 14 oz   Systolic 106 100 893  Diastolic 71 61 58  Pulse 101 93     Sitting comfortably on the sonogram table Nonlabored breathing Normal rate and rhythm Abdomen is nontender  Past pregnancies OB History  Gravida Para Term Preterm AB Living  5 4 3 1  0 4  SAB IAB Ectopic Multiple Live Births  0 0 0 0 4    # Outcome Date GA Lbr Len/2nd Weight Sex Type Anes PTL Lv  5 Current           4 Preterm 09/30/20 [redacted]w[redacted]d  4 lb 8.3 oz (2.05 kg) F CS-LTranv Spinal  LIV     Birth Comments: Late preterm, SGA female infant  3 Term 01/22/15   7 lb 4 oz (3.289 kg) M CS-Unspec   LIV     Complications: Breech presentation  2 Term 07/10/12   7 lb 4 oz (3.289 kg) M Vag-Spont EPI  LIV  1 Term 01/05/07   6 lb 2 oz (2.778 kg) M Vag-Spont EPI  LIV     I spent 30 minutes reviewing the patients chart, including labs and images as well as counseling the patient about her medical conditions. Greater than 50% of the  time was spent in direct face-to-face patient counseling.  Delora Smaller  MFM, [place]   07/02/2024  3:30 PM

## 2024-07-05 ENCOUNTER — Other Ambulatory Visit: Payer: Self-pay

## 2024-07-05 DIAGNOSIS — R7309 Other abnormal glucose: Secondary | ICD-10-CM

## 2024-07-06 ENCOUNTER — Other Ambulatory Visit: Payer: Self-pay

## 2024-07-06 DIAGNOSIS — O0990 Supervision of high risk pregnancy, unspecified, unspecified trimester: Secondary | ICD-10-CM

## 2024-07-13 ENCOUNTER — Other Ambulatory Visit

## 2024-07-13 ENCOUNTER — Ambulatory Visit (INDEPENDENT_AMBULATORY_CARE_PROVIDER_SITE_OTHER)

## 2024-07-13 ENCOUNTER — Other Ambulatory Visit: Payer: Self-pay

## 2024-07-13 VITALS — BP 110/73 | HR 91 | Wt 157.0 lb

## 2024-07-13 DIAGNOSIS — O359XX0 Maternal care for (suspected) fetal abnormality and damage, unspecified, not applicable or unspecified: Secondary | ICD-10-CM

## 2024-07-13 DIAGNOSIS — O0990 Supervision of high risk pregnancy, unspecified, unspecified trimester: Secondary | ICD-10-CM

## 2024-07-13 DIAGNOSIS — O43103 Malformation of placenta, unspecified, third trimester: Secondary | ICD-10-CM

## 2024-07-13 DIAGNOSIS — O4403 Placenta previa specified as without hemorrhage, third trimester: Secondary | ICD-10-CM

## 2024-07-13 DIAGNOSIS — O0993 Supervision of high risk pregnancy, unspecified, third trimester: Secondary | ICD-10-CM | POA: Diagnosis not present

## 2024-07-13 DIAGNOSIS — O09523 Supervision of elderly multigravida, third trimester: Secondary | ICD-10-CM

## 2024-07-13 DIAGNOSIS — O24419 Gestational diabetes mellitus in pregnancy, unspecified control: Secondary | ICD-10-CM | POA: Diagnosis not present

## 2024-07-13 DIAGNOSIS — Z3A30 30 weeks gestation of pregnancy: Secondary | ICD-10-CM | POA: Diagnosis not present

## 2024-07-13 DIAGNOSIS — O280 Abnormal hematological finding on antenatal screening of mother: Secondary | ICD-10-CM

## 2024-07-13 MED ORDER — IRON (FERROUS SULFATE) 325 (65 FE) MG PO TABS
1.0000 | ORAL_TABLET | ORAL | 3 refills | Status: AC
Start: 2024-07-14 — End: 2024-08-13

## 2024-07-13 NOTE — Assessment & Plan Note (Signed)
 Echogenic abdominal focus, 4 x 3.2 x 2.49mm; declines amnio and infectious testing.

## 2024-07-13 NOTE — Assessment & Plan Note (Signed)
 Bp, FHT, FH normal. Advised to resume PNV. Recommended to deliver at tertiary care center, pt declined but agreeable to MFM consult at [facility], appt is later this week.

## 2024-07-13 NOTE — Progress Notes (Addendum)
 PRENATAL VISIT NOTE  Subjective:  Markeya Mincy is a 38 y.o. H4E6895 at [redacted]w[redacted]d being seen today for ongoing prenatal care. She is currently monitored for the following issues for this high-risk pregnancy  Patient Active Problem List   Diagnosis Date Noted   Poor dentition requiring referral to dentistry 06/30/2024   Placental abnormality in second trimester (possible accreta) 04/29/2024   Placenta previa antepartum in second trimester (RESOLVED) 04/29/2024   Echogenic intra-abdominal focus within the fetal abdomen 04/29/2024   Elevated msAFP 2.99 MoM, 1/120 OSB risk 04/19/2024   Asymptomatic bacteriuria during pregnancy 03/09/2024   AMA (advanced maternal age) multigravida 35+ 03/04/2024   Supervision of high risk pregnancy, antepartum 02/26/2024   History of poor fetal growth 09/30/2020   History of 2 cesarean sections 09/29/2020    Patient reports no complaints.  Has MFM at [facility] appt on THursdat Contractions: Not present Vag. Bleeding: None Movement: Present Denies leaking of fluid.   Not checking glucose, plans to repeat GTT.  Stopped PNV, taking ASA81 PPIUD Girl Breast Has peds  The following portions of the patient's history were reviewed and updated as appropriate: allergies, current medications, past family history, past medical history, past social history, past surgical history and problem list.   Objective:   Vitals:   07/13/24 1508  BP: 110/73  Pulse: 91  Weight: 157 lb (71.2 kg)    Fetal Status: Fetal Heart Rate (bpm): 151   Movement: Present     General:  Alert, oriented and cooperative. Patient is in no acute distress.  Skin: Skin is warm and dry. No rash noted.   Cardiovascular: Normal heart rate noted  Respiratory: Normal respiratory effort, no problems with respiration noted  Abdomen: Soft, gravid, 30cm appropriate for gestational age.  Pain/Pressure: Absent     Pelvic: Cervical exam deferred        Extremities: Normal range of motion.  Edema:  None  Mental Status: Normal mood and affect. Normal behavior. Normal judgment and thought content.   Assessment and Plan:  Tapanga Ottaway is 38 y.o. H4E6895 at [redacted]w[redacted]d, 09/17/2024, by Last Menstrual Period  Assessment & Plan Supervision of high risk pregnancy, antepartum Bp, FHT, FH normal. Advised to resume PNV. Recommended to deliver at tertiary care center, pt declined but agreeable to MFM consult at [facility], appt is later this week. [redacted] weeks gestation of pregnancy /3\ planning: girl/br/PP IUD Multigravida of advanced maternal age in third trimester ASA81 Gestational diabetes mellitus (GDM) in third trimester, gestational diabetes method of control unspecified Elevated FBG on 2h GTT; 1h and 2h values WNL. Patient scheduled for repeat GTT next week per her preference.  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 Recommended to deliver at tertiary care center for the accreta but declines. Consult at [facility] 9/18 and plans to deliver at [facility].  Placenta previa in third trimester Still seen at 29+0.  Known fetal anomaly, antepartum, single or unspecified fetus Echogenic abdominal focus, 4 x 3.2 x 2.58mm; declines amnio and infectious testing.  Elevated msAFP 2.99 MoM, 1/120 OSB risk No concern for NTD on US . LR Panorama and negative Horizon. 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 Declines amnio. gUS to monitor for FGR. S/p genetic consult 04/29/2024.  Preterm labor symptoms and general obstetric precautions including but not limited to vaginal bleeding, contractions, leaking of fluid and fetal movement were reviewed in detail with the patient. Please refer to After Visit Summary for other counseling recommendations.   Return in about 2  weeks (around 07/27/2024) for OB visit. Future Appointments  Date Time Provider Department Center  07/23/2024  8:20 AM [facility] LAB [facility]  07/27/2024  9:15 AM [facility] PROVIDER 1 [facility]  07/27/2024  9:30  AM [facility] US5 [facility]  07/27/2024  1:15 PM Nicholaus Burnard HERO, MD [facility]  08/09/2024  2:15 PM Cleatus Moccasin, MD [facility]  08/24/2024  1:55 PM Nicholaus Burnard HERO, MD [facility]  08/31/2024  1:55 PM Cleatus Moccasin, MD [facility]  09/08/2024  1:55 PM Zina Jerilynn LABOR, MD [facility]  09/16/2024  3:15 PM Zina Jerilynn LABOR, MD [facility]  09/22/2024  3:15 PM Cresenzo, Norleen GAILS, MD [facility]    Barabara Maier, DO FMOB Fellow, Faculty Practice [facility], [facility]

## 2024-07-13 NOTE — Assessment & Plan Note (Addendum)
 No concern for NTD on US . LR Panorama and negative Horizon. 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 Declines amnio. gUS to monitor for FGR. S/p genetic consult 04/29/2024.

## 2024-07-13 NOTE — Patient Instructions (Signed)
 Please go to the Maternity Assessment Unit (MAU) at [facility] if you experience vaginal bleeding, leaking/gush of fluid like your water broke, notice decreased movement from your baby after doing kick counts, or if you have contractions the are becoming more intense or more frequent.   Fetal Movement Counts When you're pregnant, you might start feeling your baby move around the middle of your pregnancy. At first, these movements might feel like flutters, rolls, or swishes. As your baby grows, you might feel more kicks and jabs. Around week 28 of your pregnancy, your health care team may ask you to count how often your baby moves. This is important for all pregnancies, but especially for high-risk ones. Counting movements can help lessen the risk of stillbirth. Do this whenever you feel that you baby has been moving less than usual.  What is a fetal movement count? A fetal movement count is the number of times that you feel your baby move during a certain amount of time. This may also be called a kick count. There are many ways to do a kick count. Ask your team what is best for you. Pay attention to when your baby is most active. You may notice your baby's sleep and wake cycles. You may also notice things that make your baby move more. When you do a kick count, try to do it: When your baby is normally most active. At the same time each day.  How do I count fetal movements? Find a quiet, comfortable area. Sit or lie down. Write down the date, the start time, and the number of movements you feel. Count kicks, flutters, swishes, rolls, and jabs. Usually, you will feel at least 10 movements within 2 hours. Stop counting after you have felt 10 movements or if you have been counting for 2 hours. Write down the stop time.  Contact a health care provider if:  You don't feel 10 movements in 2 hours. Your baby isn't moving as it usually does. Your baby isn't moving at all. If you're not able to  reach your provider, go to an emergency room. This information is not intended to replace advice given to you by your health care provider. Make sure you discuss any questions you have with your health care provider.  Document Revised: 11/07/2023 Document Reviewed: 10/30/2022 Elsevier Patient Education  2025 [employer].  Signs and Symptoms of Labor Labor is the body's natural process of moving the baby and the placenta out of the uterus. The process of labor usually starts when the baby is full-term, 37 weeks and 0 days or more.   As your body prepares for labor and the birth of your baby, you may notice the following symptoms in the weeks and days before true labor starts: Your baby dropping lower into your pelvis to get into position for birth (lightening). When this happens, you may feel more pressure on your bladder and pelvic bone and less pressure on your ribs. This may make it easier to breathe. It may also cause you to need to urinate more often and have problems with bowel movements. Having practice contractions, also called Braxton Hicks contractions or false labor. These occur at irregular (unevenly spaced) intervals that are more than 10 minutes apart. False labor contractions are common after exercise or sexual activity. They will stop if you change position, rest, or drink fluids. These contractions are usually mild and do not get stronger over time. They may feel like: A backache or back  pain. Mild cramps, similar to menstrual cramps. Tightening or pressure in your abdomen. Passing a small amount of thick, bloody mucus from your vagina. This is called normal bloody show or losing your mucus plug. This may happen more than a week before labor begins, or right before labor begins, as the opening of the cervix starts to widen (dilate). For some women, the entire mucus plug passes at once. For others, pieces of the mucus plug may gradually pass over several days.  Signs and  symptoms that labor has begun Signs that you are in labor may include: Having contractions that come at regular (evenly spaced) intervals and increase in intensity. This may feel like more intense tightening or pressure in your abdomen that moves to your back. Feeling pressure in the vaginal area. Your water breaking (called rupture of membranes). This is when the sac of fluid that surrounds your baby breaks. Fluid leaking from your vagina may be clear or blood-tinged. Labor usually starts within 24 hours of your water breaking, but it may take longer to begin. Some people may feel a sudden gush of fluid; others may notice repeatedly damp underwear.  Go to the hospital when  Your water breaks. Your labor has started: painful, regular contractions that are 5 minutes apart You have a fever. You have bright red blood coming from your vagina. You do not feel your baby moving. You have a severe headache with or without vision problems. You have chest pain or shortness of breath. These symptoms may represent a serious problem that is an emergency. Do not wait to see if the symptoms will go away. Get medical help right away. Call your local emergency services (911 in the [place] Do not drive yourself to the hospital.  Summary Labor is your body's natural process of moving your baby and the placenta out of your uterus. The process of labor usually starts when your baby is full-term When labor starts, or if your water breaks, call your health care provider or nurse care line. Based on your situation, they will determine when you should go in for an exam. This information is not intended to replace advice given to you by your health care provider. Make sure you discuss any questions you have with your health care provider.  Document Revised: 02/27/2021 Document Reviewed: 02/27/2021-- adapted Elsevier Patient Education  2024 [employer].

## 2024-07-13 NOTE — Assessment & Plan Note (Signed)
 Still seen at 29+0.

## 2024-07-13 NOTE — Assessment & Plan Note (Signed)
 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 Recommended to deliver at tertiary care center for the accreta but declines. Consult at [facility] 9/18 and plans to deliver at [facility].

## 2024-07-13 NOTE — Assessment & Plan Note (Signed)
 ASA 81

## 2024-07-15 DIAGNOSIS — O43219 Placenta accreta, unspecified trimester: Secondary | ICD-10-CM | POA: Diagnosis not present

## 2024-07-15 DIAGNOSIS — Z98891 History of uterine scar from previous surgery: Secondary | ICD-10-CM | POA: Diagnosis not present

## 2024-07-15 DIAGNOSIS — O4403 Placenta previa specified as without hemorrhage, third trimester: Secondary | ICD-10-CM | POA: Diagnosis not present

## 2024-07-15 DIAGNOSIS — Z8742 Personal history of other diseases of the female genital tract: Secondary | ICD-10-CM | POA: Diagnosis not present

## 2024-07-15 DIAGNOSIS — O43213 Placenta accreta, third trimester: Secondary | ICD-10-CM | POA: Diagnosis not present

## 2024-07-15 DIAGNOSIS — O34219 Maternal care for unspecified type scar from previous cesarean delivery: Secondary | ICD-10-CM | POA: Diagnosis not present

## 2024-07-15 DIAGNOSIS — Z8759 Personal history of other complications of pregnancy, childbirth and the puerperium: Secondary | ICD-10-CM | POA: Diagnosis not present

## 2024-07-15 DIAGNOSIS — O24410 Gestational diabetes mellitus in pregnancy, diet controlled: Secondary | ICD-10-CM | POA: Diagnosis not present

## 2024-07-15 DIAGNOSIS — O09523 Supervision of elderly multigravida, third trimester: Secondary | ICD-10-CM | POA: Diagnosis not present

## 2024-07-15 DIAGNOSIS — Z3A30 30 weeks gestation of pregnancy: Secondary | ICD-10-CM | POA: Diagnosis not present

## 2024-07-15 DIAGNOSIS — Z363 Encounter for antenatal screening for malformations: Secondary | ICD-10-CM | POA: Diagnosis not present

## 2024-07-15 DIAGNOSIS — O09293 Supervision of pregnancy with other poor reproductive or obstetric history, third trimester: Secondary | ICD-10-CM | POA: Diagnosis not present

## 2024-07-22 DIAGNOSIS — O09523 Supervision of elderly multigravida, third trimester: Secondary | ICD-10-CM | POA: Diagnosis not present

## 2024-07-22 DIAGNOSIS — O34219 Maternal care for unspecified type scar from previous cesarean delivery: Secondary | ICD-10-CM | POA: Diagnosis not present

## 2024-07-22 DIAGNOSIS — O43213 Placenta accreta, third trimester: Secondary | ICD-10-CM | POA: Diagnosis not present

## 2024-07-22 DIAGNOSIS — O365930 Maternal care for other known or suspected poor fetal growth, third trimester, not applicable or unspecified: Secondary | ICD-10-CM | POA: Diagnosis not present

## 2024-07-22 DIAGNOSIS — Z3A31 31 weeks gestation of pregnancy: Secondary | ICD-10-CM | POA: Diagnosis not present

## 2024-07-23 ENCOUNTER — Other Ambulatory Visit

## 2024-07-27 ENCOUNTER — Encounter: Admitting: Obstetrics and Gynecology

## 2024-07-27 ENCOUNTER — Ambulatory Visit

## 2024-08-02 ENCOUNTER — Encounter: Payer: Self-pay | Admitting: *Deleted

## 2024-08-05 DIAGNOSIS — Z3A33 33 weeks gestation of pregnancy: Secondary | ICD-10-CM | POA: Diagnosis not present

## 2024-08-05 DIAGNOSIS — D509 Iron deficiency anemia, unspecified: Secondary | ICD-10-CM | POA: Diagnosis not present

## 2024-08-05 DIAGNOSIS — O280 Abnormal hematological finding on antenatal screening of mother: Secondary | ICD-10-CM | POA: Diagnosis not present

## 2024-08-05 DIAGNOSIS — O34219 Maternal care for unspecified type scar from previous cesarean delivery: Secondary | ICD-10-CM | POA: Diagnosis not present

## 2024-08-05 DIAGNOSIS — O99013 Anemia complicating pregnancy, third trimester: Secondary | ICD-10-CM | POA: Diagnosis not present

## 2024-08-05 DIAGNOSIS — O43213 Placenta accreta, third trimester: Secondary | ICD-10-CM | POA: Diagnosis not present

## 2024-08-05 DIAGNOSIS — O365930 Maternal care for other known or suspected poor fetal growth, third trimester, not applicable or unspecified: Secondary | ICD-10-CM | POA: Diagnosis not present

## 2024-08-05 DIAGNOSIS — N3000 Acute cystitis without hematuria: Secondary | ICD-10-CM | POA: Diagnosis not present

## 2024-08-05 DIAGNOSIS — O2313 Infections of bladder in pregnancy, third trimester: Secondary | ICD-10-CM | POA: Diagnosis not present

## 2024-08-05 DIAGNOSIS — O09523 Supervision of elderly multigravida, third trimester: Secondary | ICD-10-CM | POA: Diagnosis not present

## 2024-08-09 ENCOUNTER — Encounter: Admitting: Obstetrics and Gynecology

## 2024-08-12 DIAGNOSIS — O34219 Maternal care for unspecified type scar from previous cesarean delivery: Secondary | ICD-10-CM | POA: Diagnosis not present

## 2024-08-12 DIAGNOSIS — Z98891 History of uterine scar from previous surgery: Secondary | ICD-10-CM | POA: Diagnosis not present

## 2024-08-12 DIAGNOSIS — O365930 Maternal care for other known or suspected poor fetal growth, third trimester, not applicable or unspecified: Secondary | ICD-10-CM | POA: Diagnosis not present

## 2024-08-12 DIAGNOSIS — O43213 Placenta accreta, third trimester: Secondary | ICD-10-CM | POA: Diagnosis not present

## 2024-08-12 DIAGNOSIS — Z3689 Encounter for other specified antenatal screening: Secondary | ICD-10-CM | POA: Diagnosis not present

## 2024-08-12 DIAGNOSIS — O365990 Maternal care for other known or suspected poor fetal growth, unspecified trimester, not applicable or unspecified: Secondary | ICD-10-CM | POA: Diagnosis not present

## 2024-08-12 DIAGNOSIS — O09523 Supervision of elderly multigravida, third trimester: Secondary | ICD-10-CM | POA: Diagnosis not present

## 2024-08-12 DIAGNOSIS — Z3A34 34 weeks gestation of pregnancy: Secondary | ICD-10-CM | POA: Diagnosis not present

## 2024-08-12 DIAGNOSIS — O3429 Maternal care due to uterine scar from other previous surgery: Secondary | ICD-10-CM | POA: Diagnosis not present

## 2024-08-17 DIAGNOSIS — Z87440 Personal history of urinary (tract) infections: Secondary | ICD-10-CM | POA: Diagnosis not present

## 2024-08-17 DIAGNOSIS — O9902 Anemia complicating childbirth: Secondary | ICD-10-CM | POA: Diagnosis not present

## 2024-08-17 DIAGNOSIS — O365930 Maternal care for other known or suspected poor fetal growth, third trimester, not applicable or unspecified: Secondary | ICD-10-CM | POA: Diagnosis not present

## 2024-08-17 DIAGNOSIS — D509 Iron deficiency anemia, unspecified: Secondary | ICD-10-CM | POA: Diagnosis not present

## 2024-08-17 DIAGNOSIS — D62 Acute posthemorrhagic anemia: Secondary | ICD-10-CM | POA: Diagnosis not present

## 2024-08-17 DIAGNOSIS — O34211 Maternal care for low transverse scar from previous cesarean delivery: Secondary | ICD-10-CM | POA: Diagnosis not present

## 2024-08-17 DIAGNOSIS — Z3A35 35 weeks gestation of pregnancy: Secondary | ICD-10-CM | POA: Diagnosis not present

## 2024-08-17 DIAGNOSIS — O43213 Placenta accreta, third trimester: Secondary | ICD-10-CM | POA: Diagnosis not present

## 2024-08-18 DIAGNOSIS — Z3A35 35 weeks gestation of pregnancy: Secondary | ICD-10-CM | POA: Diagnosis not present

## 2024-08-18 DIAGNOSIS — O43213 Placenta accreta, third trimester: Secondary | ICD-10-CM | POA: Diagnosis not present

## 2024-08-18 DIAGNOSIS — O43813 Placental infarction, third trimester: Secondary | ICD-10-CM | POA: Diagnosis not present

## 2024-08-18 DIAGNOSIS — O9902 Anemia complicating childbirth: Secondary | ICD-10-CM | POA: Diagnosis not present

## 2024-08-18 DIAGNOSIS — D62 Acute posthemorrhagic anemia: Secondary | ICD-10-CM | POA: Diagnosis not present

## 2024-08-18 DIAGNOSIS — O321XX0 Maternal care for breech presentation, not applicable or unspecified: Secondary | ICD-10-CM | POA: Diagnosis not present

## 2024-08-18 DIAGNOSIS — O43893 Other placental disorders, third trimester: Secondary | ICD-10-CM | POA: Diagnosis not present

## 2024-08-18 DIAGNOSIS — O34212 Maternal care for vertical scar from previous cesarean delivery: Secondary | ICD-10-CM | POA: Diagnosis not present

## 2024-08-24 ENCOUNTER — Encounter: Admitting: Obstetrics and Gynecology

## 2024-08-31 ENCOUNTER — Encounter: Admitting: Obstetrics and Gynecology

## 2024-09-08 ENCOUNTER — Encounter: Admitting: Obstetrics and Gynecology

## 2024-09-16 ENCOUNTER — Encounter: Admitting: Obstetrics and Gynecology

## 2024-09-22 ENCOUNTER — Encounter: Admitting: Family Medicine

## 2024-10-11 DIAGNOSIS — O43213 Placenta accreta, third trimester: Secondary | ICD-10-CM | POA: Diagnosis not present

## 2024-10-18 ENCOUNTER — Encounter: Payer: Self-pay | Admitting: General Practice
